# Patient Record
Sex: Female | Born: 1935 | Race: Black or African American | Hispanic: No | Smoking: Never smoker
Health system: Southern US, Community
[De-identification: ages and names within clinical notes are randomized; demographics above are authoritative.]

## PROBLEM LIST (undated history)

## (undated) DIAGNOSIS — B029 Zoster without complications: Secondary | ICD-10-CM

## (undated) DIAGNOSIS — I10 Essential (primary) hypertension: Secondary | ICD-10-CM

## (undated) DIAGNOSIS — Z8551 Personal history of malignant neoplasm of bladder: Secondary | ICD-10-CM

## (undated) HISTORY — PX: CYSTECTOMY W/ CONTINENT DIVERSION: SUR360

---

## 1968-09-19 DIAGNOSIS — Z8551 Personal history of malignant neoplasm of bladder: Secondary | ICD-10-CM

## 1968-09-19 HISTORY — DX: Personal history of malignant neoplasm of bladder: Z85.51

## 1997-06-01 ENCOUNTER — Ambulatory Visit (HOSPITAL_COMMUNITY): Admission: RE | Admit: 1997-06-01 | Discharge: 1997-06-01 | Payer: Self-pay | Admitting: Internal Medicine

## 1997-12-04 ENCOUNTER — Ambulatory Visit: Admission: RE | Admit: 1997-12-04 | Discharge: 1997-12-04 | Payer: Self-pay | Admitting: Gynecologic Oncology

## 1997-12-05 ENCOUNTER — Other Ambulatory Visit: Admission: RE | Admit: 1997-12-05 | Discharge: 1997-12-05 | Payer: Self-pay | Admitting: Gynecologic Oncology

## 1997-12-07 ENCOUNTER — Ambulatory Visit (HOSPITAL_COMMUNITY): Admission: RE | Admit: 1997-12-07 | Discharge: 1997-12-07 | Payer: Self-pay | Admitting: *Deleted

## 1998-01-26 ENCOUNTER — Encounter: Payer: Self-pay | Admitting: Emergency Medicine

## 1998-01-26 ENCOUNTER — Emergency Department (HOSPITAL_COMMUNITY): Admission: EM | Admit: 1998-01-26 | Discharge: 1998-01-27 | Payer: Self-pay | Admitting: Emergency Medicine

## 1998-11-13 ENCOUNTER — Ambulatory Visit: Admission: RE | Admit: 1998-11-13 | Discharge: 1998-11-13 | Payer: Self-pay | Admitting: Gynecologic Oncology

## 1998-11-13 ENCOUNTER — Other Ambulatory Visit: Admission: RE | Admit: 1998-11-13 | Discharge: 1998-11-13 | Payer: Self-pay | Admitting: Gynecologic Oncology

## 1998-12-10 ENCOUNTER — Encounter: Admission: RE | Admit: 1998-12-10 | Discharge: 1998-12-10 | Payer: Self-pay | Admitting: *Deleted

## 1998-12-10 ENCOUNTER — Ambulatory Visit (HOSPITAL_COMMUNITY): Admission: RE | Admit: 1998-12-10 | Discharge: 1998-12-10 | Payer: Self-pay | Admitting: *Deleted

## 1998-12-10 ENCOUNTER — Encounter: Payer: Self-pay | Admitting: *Deleted

## 1999-11-19 ENCOUNTER — Ambulatory Visit (HOSPITAL_COMMUNITY): Admission: RE | Admit: 1999-11-19 | Discharge: 1999-11-19 | Payer: Self-pay | Admitting: Gastroenterology

## 1999-11-19 ENCOUNTER — Encounter (INDEPENDENT_AMBULATORY_CARE_PROVIDER_SITE_OTHER): Payer: Self-pay | Admitting: *Deleted

## 1999-12-16 ENCOUNTER — Ambulatory Visit (HOSPITAL_COMMUNITY): Admission: RE | Admit: 1999-12-16 | Discharge: 1999-12-16 | Payer: Self-pay | Admitting: *Deleted

## 2000-02-10 ENCOUNTER — Other Ambulatory Visit: Admission: RE | Admit: 2000-02-10 | Discharge: 2000-02-10 | Payer: Self-pay | Admitting: Gynecologic Oncology

## 2000-02-10 ENCOUNTER — Ambulatory Visit: Admission: RE | Admit: 2000-02-10 | Discharge: 2000-02-10 | Payer: Self-pay | Admitting: Gynecologic Oncology

## 2000-12-21 ENCOUNTER — Encounter: Payer: Self-pay | Admitting: Internal Medicine

## 2000-12-21 ENCOUNTER — Ambulatory Visit (HOSPITAL_COMMUNITY): Admission: RE | Admit: 2000-12-21 | Discharge: 2000-12-21 | Payer: Self-pay | Admitting: *Deleted

## 2001-04-06 ENCOUNTER — Other Ambulatory Visit: Admission: RE | Admit: 2001-04-06 | Discharge: 2001-04-06 | Payer: Self-pay | Admitting: Gynecologic Oncology

## 2001-04-06 ENCOUNTER — Ambulatory Visit: Admission: RE | Admit: 2001-04-06 | Discharge: 2001-04-06 | Payer: Self-pay | Admitting: Gynecologic Oncology

## 2001-11-29 ENCOUNTER — Encounter: Payer: Self-pay | Admitting: Emergency Medicine

## 2001-11-29 ENCOUNTER — Emergency Department (HOSPITAL_COMMUNITY): Admission: EM | Admit: 2001-11-29 | Discharge: 2001-11-29 | Payer: Self-pay | Admitting: Emergency Medicine

## 2001-12-27 ENCOUNTER — Ambulatory Visit (HOSPITAL_COMMUNITY): Admission: RE | Admit: 2001-12-27 | Discharge: 2001-12-27 | Payer: Self-pay | Admitting: Internal Medicine

## 2001-12-27 ENCOUNTER — Encounter: Payer: Self-pay | Admitting: Internal Medicine

## 2001-12-28 ENCOUNTER — Encounter (INDEPENDENT_AMBULATORY_CARE_PROVIDER_SITE_OTHER): Payer: Self-pay | Admitting: Specialist

## 2001-12-28 ENCOUNTER — Ambulatory Visit (HOSPITAL_COMMUNITY): Admission: RE | Admit: 2001-12-28 | Discharge: 2001-12-28 | Payer: Self-pay | Admitting: Gastroenterology

## 2002-05-09 ENCOUNTER — Ambulatory Visit: Admission: RE | Admit: 2002-05-09 | Discharge: 2002-05-09 | Payer: Self-pay | Admitting: Gynecologic Oncology

## 2002-05-09 ENCOUNTER — Encounter (INDEPENDENT_AMBULATORY_CARE_PROVIDER_SITE_OTHER): Payer: Self-pay

## 2002-05-09 ENCOUNTER — Other Ambulatory Visit: Admission: RE | Admit: 2002-05-09 | Discharge: 2002-05-09 | Payer: Self-pay | Admitting: Gynecologic Oncology

## 2002-11-17 ENCOUNTER — Ambulatory Visit (HOSPITAL_COMMUNITY): Admission: RE | Admit: 2002-11-17 | Discharge: 2002-11-18 | Payer: Self-pay | Admitting: Ophthalmology

## 2002-11-17 ENCOUNTER — Encounter (INDEPENDENT_AMBULATORY_CARE_PROVIDER_SITE_OTHER): Payer: Self-pay | Admitting: *Deleted

## 2003-03-08 ENCOUNTER — Ambulatory Visit (HOSPITAL_COMMUNITY): Admission: RE | Admit: 2003-03-08 | Discharge: 2003-03-09 | Payer: Self-pay | Admitting: Ophthalmology

## 2003-09-25 ENCOUNTER — Ambulatory Visit: Admission: RE | Admit: 2003-09-25 | Discharge: 2003-09-25 | Payer: Self-pay | Admitting: Gynecologic Oncology

## 2003-09-25 ENCOUNTER — Other Ambulatory Visit: Admission: RE | Admit: 2003-09-25 | Discharge: 2003-10-18 | Payer: Self-pay | Admitting: Obstetrics and Gynecology

## 2003-09-25 ENCOUNTER — Encounter (INDEPENDENT_AMBULATORY_CARE_PROVIDER_SITE_OTHER): Payer: Self-pay | Admitting: *Deleted

## 2003-12-06 ENCOUNTER — Encounter: Admission: RE | Admit: 2003-12-06 | Discharge: 2003-12-06 | Payer: Self-pay | Admitting: Internal Medicine

## 2004-09-23 ENCOUNTER — Other Ambulatory Visit: Admission: RE | Admit: 2004-09-23 | Discharge: 2004-09-23 | Payer: Self-pay | Admitting: Gynecologic Oncology

## 2004-09-23 ENCOUNTER — Ambulatory Visit: Admission: RE | Admit: 2004-09-23 | Discharge: 2004-09-23 | Payer: Self-pay | Admitting: Gynecologic Oncology

## 2004-09-23 ENCOUNTER — Encounter (INDEPENDENT_AMBULATORY_CARE_PROVIDER_SITE_OTHER): Payer: Self-pay | Admitting: *Deleted

## 2004-12-23 ENCOUNTER — Encounter: Admission: RE | Admit: 2004-12-23 | Discharge: 2004-12-23 | Payer: Self-pay | Admitting: Internal Medicine

## 2005-10-06 ENCOUNTER — Ambulatory Visit: Admission: RE | Admit: 2005-10-06 | Discharge: 2005-10-06 | Payer: Self-pay | Admitting: Gynecologic Oncology

## 2005-10-06 ENCOUNTER — Other Ambulatory Visit: Admission: RE | Admit: 2005-10-06 | Discharge: 2005-10-06 | Payer: Self-pay | Admitting: Gynecologic Oncology

## 2005-10-06 ENCOUNTER — Encounter (INDEPENDENT_AMBULATORY_CARE_PROVIDER_SITE_OTHER): Payer: Self-pay | Admitting: *Deleted

## 2005-12-29 ENCOUNTER — Encounter: Admission: RE | Admit: 2005-12-29 | Discharge: 2005-12-29 | Payer: Self-pay | Admitting: Internal Medicine

## 2006-09-21 ENCOUNTER — Encounter: Payer: Self-pay | Admitting: Gynecology

## 2006-09-21 ENCOUNTER — Ambulatory Visit: Admission: RE | Admit: 2006-09-21 | Discharge: 2006-09-21 | Payer: Self-pay | Admitting: Gynecologic Oncology

## 2006-09-21 ENCOUNTER — Other Ambulatory Visit: Admission: RE | Admit: 2006-09-21 | Discharge: 2006-09-21 | Payer: Self-pay | Admitting: Gynecologic Oncology

## 2007-01-27 ENCOUNTER — Encounter: Admission: RE | Admit: 2007-01-27 | Discharge: 2007-01-27 | Payer: Self-pay | Admitting: Internal Medicine

## 2007-10-11 ENCOUNTER — Encounter: Payer: Self-pay | Admitting: Gynecologic Oncology

## 2007-10-11 ENCOUNTER — Other Ambulatory Visit: Admission: RE | Admit: 2007-10-11 | Discharge: 2007-10-11 | Payer: Self-pay | Admitting: Gynecologic Oncology

## 2007-10-11 ENCOUNTER — Ambulatory Visit: Admission: RE | Admit: 2007-10-11 | Discharge: 2007-10-11 | Payer: Self-pay | Admitting: Gynecologic Oncology

## 2008-01-31 ENCOUNTER — Encounter: Admission: RE | Admit: 2008-01-31 | Discharge: 2008-01-31 | Payer: Self-pay | Admitting: Internal Medicine

## 2008-08-15 ENCOUNTER — Other Ambulatory Visit: Admission: RE | Admit: 2008-08-15 | Discharge: 2008-08-15 | Payer: Self-pay | Admitting: Gynecologic Oncology

## 2008-08-15 ENCOUNTER — Ambulatory Visit: Admission: RE | Admit: 2008-08-15 | Discharge: 2008-08-15 | Payer: Self-pay | Admitting: Gynecologic Oncology

## 2008-08-15 ENCOUNTER — Encounter (INDEPENDENT_AMBULATORY_CARE_PROVIDER_SITE_OTHER): Payer: Self-pay | Admitting: Gynecologic Oncology

## 2009-01-31 ENCOUNTER — Encounter: Admission: RE | Admit: 2009-01-31 | Discharge: 2009-01-31 | Payer: Self-pay | Admitting: Internal Medicine

## 2009-06-15 ENCOUNTER — Inpatient Hospital Stay (HOSPITAL_COMMUNITY): Admission: EM | Admit: 2009-06-15 | Discharge: 2009-06-20 | Payer: Self-pay | Admitting: Emergency Medicine

## 2009-07-25 ENCOUNTER — Ambulatory Visit: Admission: RE | Admit: 2009-07-25 | Discharge: 2009-07-25 | Payer: Self-pay | Admitting: Gynecologic Oncology

## 2009-07-25 ENCOUNTER — Other Ambulatory Visit: Admission: RE | Admit: 2009-07-25 | Discharge: 2009-07-25 | Payer: Self-pay | Admitting: Gynecologic Oncology

## 2010-02-04 ENCOUNTER — Encounter
Admission: RE | Admit: 2010-02-04 | Discharge: 2010-02-04 | Payer: Self-pay | Source: Home / Self Care | Attending: Internal Medicine | Admitting: Internal Medicine

## 2010-02-09 ENCOUNTER — Encounter: Payer: Self-pay | Admitting: Internal Medicine

## 2010-04-07 LAB — COMPREHENSIVE METABOLIC PANEL
ALT: 16 U/L (ref 0–35)
AST: 32 U/L (ref 0–37)
Albumin: 2.7 g/dL — ABNORMAL LOW (ref 3.5–5.2)
Albumin: 3.5 g/dL (ref 3.5–5.2)
Alkaline Phosphatase: 33 U/L — ABNORMAL LOW (ref 39–117)
BUN: 29 mg/dL — ABNORMAL HIGH (ref 6–23)
BUN: 34 mg/dL — ABNORMAL HIGH (ref 6–23)
BUN: 37 mg/dL — ABNORMAL HIGH (ref 6–23)
CO2: 29 mEq/L (ref 19–32)
CO2: 30 mEq/L (ref 19–32)
Calcium: 10.4 mg/dL (ref 8.4–10.5)
Calcium: 8.2 mg/dL — ABNORMAL LOW (ref 8.4–10.5)
Calcium: 8.8 mg/dL (ref 8.4–10.5)
Chloride: 103 mEq/L (ref 96–112)
Chloride: 112 mEq/L (ref 96–112)
Chloride: 96 mEq/L (ref 96–112)
Creatinine, Ser: 1.55 mg/dL — ABNORMAL HIGH (ref 0.4–1.2)
Creatinine, Ser: 1.62 mg/dL — ABNORMAL HIGH (ref 0.4–1.2)
Creatinine, Ser: 1.83 mg/dL — ABNORMAL HIGH (ref 0.4–1.2)
GFR calc Af Amer: 36 mL/min — ABNORMAL LOW (ref >60)
GFR calc non Af Amer: 26 mL/min — ABNORMAL LOW (ref >60)
GFR calc non Af Amer: 30 mL/min — ABNORMAL LOW (ref >60)
GFR calc non Af Amer: 32 mL/min — ABNORMAL LOW (ref >60)
Glucose, Bld: 138 mg/dL — ABNORMAL HIGH (ref 70–99)
Potassium: 2.8 mEq/L — ABNORMAL LOW (ref 3.5–5.1)
Sodium: 137 mEq/L (ref 135–145)
Total Bilirubin: 0.6 mg/dL (ref 0.3–1.2)
Total Bilirubin: 0.8 mg/dL (ref 0.3–1.2)
Total Bilirubin: 1.2 mg/dL (ref 0.3–1.2)
Total Protein: 7.4 g/dL (ref 6.0–8.3)

## 2010-04-07 LAB — CBC
HCT: 28.4 % — ABNORMAL LOW (ref 36.0–46.0)
HCT: 28.8 % — ABNORMAL LOW (ref 36.0–46.0)
HCT: 30.3 % — ABNORMAL LOW (ref 36.0–46.0)
HCT: 30.7 % — ABNORMAL LOW (ref 36.0–46.0)
HCT: 34.3 % — ABNORMAL LOW (ref 36.0–46.0)
Hemoglobin: 9.2 g/dL — ABNORMAL LOW (ref 12.0–15.0)
Hemoglobin: 11.2 g/dL — ABNORMAL LOW (ref 12.0–15.0)
Hemoglobin: 9.6 g/dL — ABNORMAL LOW (ref 12.0–15.0)
MCHC: 32.4 g/dL (ref 30.0–36.0)
MCHC: 32.8 g/dL (ref 30.0–36.0)
MCHC: 33.3 g/dL (ref 30.0–36.0)
MCHC: 33.4 g/dL (ref 30.0–36.0)
MCHC: 33.6 g/dL (ref 30.0–36.0)
MCV: 90.5 fL (ref 78.0–100.0)
MCV: 89.6 fL (ref 78.0–100.0)
MCV: 89.6 fL (ref 78.0–100.0)
MCV: 90 fL (ref 78.0–100.0)
Platelets: 195 10*3/uL (ref 150–400)
Platelets: 223 10*3/uL (ref 150–400)
Platelets: 223 10*3/uL (ref 150–400)
RBC: 3.14 MIL/uL — ABNORMAL LOW (ref 3.87–5.11)
RBC: 3.38 MIL/uL — ABNORMAL LOW (ref 3.87–5.11)
RBC: 3.81 MIL/uL — ABNORMAL LOW (ref 3.87–5.11)
RDW: 14.5 % (ref 11.5–15.5)
RDW: 13.9 % (ref 11.5–15.5)
RDW: 14.4 % (ref 11.5–15.5)
WBC: 10.5 10*3/uL (ref 4.0–10.5)
WBC: 11.8 10*3/uL — ABNORMAL HIGH (ref 4.0–10.5)
WBC: 7.2 10*3/uL (ref 4.0–10.5)
WBC: 7.3 10*3/uL (ref 4.0–10.5)

## 2010-04-07 LAB — GLUCOSE, CAPILLARY
Glucose-Capillary: 105 mg/dL — ABNORMAL HIGH (ref 70–99)
Glucose-Capillary: 122 mg/dL — ABNORMAL HIGH (ref 70–99)
Glucose-Capillary: 123 mg/dL — ABNORMAL HIGH (ref 70–99)
Glucose-Capillary: 99 mg/dL (ref 70–99)
Glucose-Capillary: 105 mg/dL — ABNORMAL HIGH (ref 70–99)
Glucose-Capillary: 106 mg/dL — ABNORMAL HIGH (ref 70–99)
Glucose-Capillary: 107 mg/dL — ABNORMAL HIGH (ref 70–99)
Glucose-Capillary: 114 mg/dL — ABNORMAL HIGH (ref 70–99)
Glucose-Capillary: 117 mg/dL — ABNORMAL HIGH (ref 70–99)
Glucose-Capillary: 125 mg/dL — ABNORMAL HIGH (ref 70–99)
Glucose-Capillary: 141 mg/dL — ABNORMAL HIGH (ref 70–99)
Glucose-Capillary: 145 mg/dL — ABNORMAL HIGH (ref 70–99)
Glucose-Capillary: 92 mg/dL (ref 70–99)
Glucose-Capillary: 93 mg/dL (ref 70–99)

## 2010-04-07 LAB — URINALYSIS, ROUTINE W REFLEX MICROSCOPIC
Bilirubin Urine: NEGATIVE
Glucose, UA: NEGATIVE mg/dL
Ketones, ur: NEGATIVE mg/dL
Leukocytes, UA: NEGATIVE
Nitrite: NEGATIVE
Protein, ur: >300 mg/dL — AB
Specific Gravity, Urine: 1.015 (ref 1.005–1.030)
Urobilinogen, UA: 0.2 mg/dL (ref 0.0–1.0)
pH: 6.5 (ref 5.0–8.0)

## 2010-04-07 LAB — BASIC METABOLIC PANEL
BUN: 23 mg/dL (ref 6–23)
CO2: 19 mEq/L (ref 19–32)
CO2: 22 mEq/L (ref 19–32)
Calcium: 7.3 mg/dL — ABNORMAL LOW (ref 8.4–10.5)
Chloride: 119 mEq/L — ABNORMAL HIGH (ref 96–112)
Creatinine, Ser: 1.28 mg/dL — ABNORMAL HIGH (ref 0.4–1.2)
GFR calc Af Amer: 48 mL/min — ABNORMAL LOW (ref >60)
GFR calc non Af Amer: 39 mL/min — ABNORMAL LOW (ref >60)
Glucose, Bld: 117 mg/dL — ABNORMAL HIGH (ref 70–99)
Glucose, Bld: 132 mg/dL — ABNORMAL HIGH (ref 70–99)
Potassium: 4.7 mEq/L (ref 3.5–5.1)
Potassium: 4.7 mEq/L (ref 3.5–5.1)
Sodium: 142 mEq/L (ref 135–145)
Sodium: 145 mEq/L (ref 135–145)

## 2010-04-07 LAB — DIFFERENTIAL
Basophils Absolute: 0 10*3/uL (ref 0.0–0.1)
Basophils Relative: 0 % (ref 0–1)
Eosinophils Absolute: 0 10*3/uL (ref 0.0–0.7)
Eosinophils Relative: 0 % (ref 0–5)
Lymphocytes Relative: 13 % (ref 12–46)
Lymphs Abs: 0.9 10*3/uL (ref 0.7–4.0)
Monocytes Absolute: 0.6 10*3/uL (ref 0.1–1.0)
Monocytes Relative: 8 % (ref 3–12)
Neutro Abs: 5.6 10*3/uL (ref 1.7–7.7)
Neutrophils Relative %: 79 % — ABNORMAL HIGH (ref 43–77)

## 2010-04-07 LAB — URINE MICROSCOPIC-ADD ON

## 2010-04-07 LAB — HEMOGLOBIN A1C
Hgb A1c MFr Bld: 5.7 % — ABNORMAL HIGH (ref <5.7)
Mean Plasma Glucose: 117 mg/dL — ABNORMAL HIGH (ref <117)

## 2010-06-03 NOTE — Consult Note (Signed)
[name], [name]                 ACCOUNT NO.:  [id number]   MEDICAL RECORD NO.:  [id number]          PATIENT TYPE:  OUT   LOCATION:  GYN                          FACILITY:  [facility]   PHYSICIAN:  John T. [name], M.D.    DATE OF BIRTH:  1919/06/04   DATE OF CONSULTATION:  08/15/2008  DATE OF DISCHARGE:                                 CONSULTATION   CHIEF COMPLAINT:  This 77 year old African American woman returns for  ongoing followup of cervical cancer treated with pelvic exenteration.   HISTORY OF PRESENT ILLNESS:  This patient developed recurrent cervical  cancer following total pelvic radiation and underwent an anterior  exenteration at [facility] in June 1985.  Followups since have been without evidence of recurrent disease.  Her  urostomy has functioned well and is currently giving her no problems.  She does note an occasional vaginal discharge, but no bleeding.  Bowel  function is normal.  She is up to date on colonoscopy.  Denies  unintentional weight change, obstructive type symptoms or leg swelling.   PAST MEDICAL HISTORY:  Significant for hypertension.   CURRENT MEDICATIONS:  1. Atenolol.  2. Fosamax.   ALLERGIES:  NONE KNOWN.   PERSONAL/SOCIAL HISTORY:  Widowed, nonsmoker.   FAMILY HISTORY:  Noncontributory.   REVIEW OF SYSTEMS:  No other complaints in a 10-system review.   PHYSICAL EXAMINATION:  VITAL SIGNS:  Weight 115 pounds (stable) and  vital signs stable as recorded with blood pressure 136/78.  LYMPH SURVEY:  Reveals no pathologic lymphadenopathy.  BACK:  Slight widow's hump with no spinous or CVA tenderness.  ABDOMEN:  Soft and benign with a left urostomy bag.  Incision well  healed with no hernia.  There is no ascites, tenderness or mass.  EXTREMITIES:  No edema, cords or Homan's.  PELVIC:  External genitalia normal.  The vagina is foreshortened to  approximately 4 cm with clear mucosa.  Absent urethra and bladder.  Bimanual  examination and rectovaginal examinations reveal no mass or  nodularity, absent uterus and cervix.   ASSESSMENT:  Cervical carcinoma, no active disease.   PLAN:  Cytology obtained and will be communicated to the patient.  She  should continue to have annual cytology and we would be glad to see her  on a p.r.n. basis or annually.      John T. [name], M.D.  Electronically Signed     JTS/MEDQ  D:  08/15/2008  T:  08/15/2008  Job:  [id number]   cc:   Telford Nab, R.N.  [address]  [place], [place] [address]   Gaspar Garbe, M.D.  Fax: [phone]

## 2010-06-03 NOTE — Consult Note (Signed)
NAMEWILLIE, Crystal                 ACCOUNT NO.:  [id number]   MEDICAL RECORD NO.:  [id number]          PATIENT TYPE:  OUT   LOCATION:  GYN                          FACILITY:  [facility]   PHYSICIAN:  John T. [name], M.D.    DATE OF BIRTH:  1919-01-31   DATE OF CONSULTATION:  09/21/2006  DATE OF DISCHARGE:                                 CONSULTATION   CHIEF COMPLAINT:  Followup of cervical cancer treated with anterior  exenteration.   HISTORY OF PRESENT ILLNESS:  This patient developed recurrent cervical  carcinoma following total pelvic radiation and underwent an anterior  exenteration at [facility] in June 1985.  She has  been followed with frequent examinations through September 2007, with no  evidence of recurrent disease.  She relates no new medical problems.  She has chronic intermittent right leg edema which is stable.  She has a  tendency towards diarrhea, depending upon dietary indiscretion.  She  denies vaginal bleeding, pelvic pain, or problems with her ileal  conduit.   PAST HISTORY, PERSONAL AND SOCIAL HISTORY, FAMILY HISTORY:  Are reviewed  and unchanged from those recorded in 2005.   MEDICATIONS:  Atenolol, Fosamax.   ALLERGIES:  None known.   REVIEW OF SYSTEMS:  The patient states that she is slowing down because  of age, but has no other specific complaints in a 10-system review.   PHYSICAL EXAMINATION:  VITAL SIGNS:  Weight 116.5 pounds, blood pressure  166/84.  GENERAL:  The patient is alert and oriented x3, in no acute distress.  LYMPH NODE SURVEY:  No pathologic lymphadenopathy.  BACK:  Kyphoscoliosis, with no spinous or CVA tenderness.  ABDOMEN:  Scaphoid, soft, and benign, with no mass, ascites, or hernia.  Conduit stoma is without prolapse or peristomal hernia.  EXTREMITIES:  Have no edema, cords or Homans'.  PELVIC:  External genitalia and BUS are normal.  The urethra is  surgically absent.  The vagina is foreshortened to a depth of 4  cm, with  absent cervix and uterus, and no submucosal masses.  Bimanual and  rectovaginal examinations disclose no mass or nodularity.   ASSESSMENT:  Cervical carcinoma, no evidence of disease.   PLAN:  The patient will continue vaginal Premarin, and cytology is  repeated.  She remains at high risk and requires annual cytologic  surveillance.  We will be glad to continue to see her on an annual  basis.      John T. [name], M.D.  Electronically Signed     JTS/MEDQ  D:  09/21/2006  T:  09/21/2006  Job:  [id number]   cc:   Telford Nab, R.N.  [address]  [place], [place] [address]

## 2010-06-03 NOTE — Consult Note (Signed)
[name], [name]                 ACCOUNT NO.:  [id number]   MEDICAL RECORD NO.:  [id number]          PATIENT TYPE:  OUT   LOCATION:  GYN                          FACILITY:  [facility]   PHYSICIAN:  Paola A. Duard Brady, MD    DATE OF BIRTH:  Jul 24, 1919   DATE OF CONSULTATION:  DATE OF DISCHARGE:                                 CONSULTATION   HISTORY:  The patient is an 77 year old with a history of cervical  carcinoma who developed recurrent disease after total pelvic radiation.  She underwent anterior exoneration at [facility] by Dr. [name] in  1985 and has been free of disease since that time.  She was last seen by  Dr. [name] in September 2008 at which time her exam and Pap smear were  unremarkable.  She comes in today for followup and is overall without  complaints.   REVIEW OF SYSTEMS:  She denies any chest pain, shortness breath, nausea,  vomiting, fevers, chills, headaches or visual changes.  She denies any  significant change in her bowel or bladder habits.  She does have bowel  movements twice a day usually after breakfast and dinner.  Occasionally,  she will have bowel movements 3 times a day particularly if she eats  green beans or lettuce.  There is no blood in her stool.  She had a  colonoscopy in November 2008.  She occasionally will have developed a  sore or boil where her urostomy bag rubs just under her ribs, but it  does not require any change in her stomal appliances.  She either tapes  it down or cuts it, and that alleviates the symptoms.  She denies any  unintentional weight loss, weight gain, nausea, vomiting, fevers,  chills, headaches or visual changes.  She denies any vaginal bleeding.   MEDICATIONS:  She does not recall the names, but she is on  antihypertensive.  She is also on calcium. Does not recall the names of  her other medications.   HEALTH MAINTENANCE:  She had a mammogram about 2 months ago.  She had a  colonoscopy in November 2008.   PHYSICAL  EXAMINATION:  VITAL SIGNS:  Blood pressure 180/104.  GENERAL:  Well-nourished, well-developed female in no acute distress.  NECK:  Supple.  There is no lymphadenopathy, no thyromegaly.  LUNGS:  Clear to auscultation bilaterally.  CARDIOVASCULAR:  Regular rate and rhythm.  She may have a 1/6 systolic  ejection murmur.  ABDOMEN:  Shows a well-healed vertical midline incision.  She has a left  upper quadrant urostomy bag.  There is clear urine.  The stoma is pink  with clean mucosa.  Groins are negative for adenopathy.  EXTREMITIES:  There is no edema.  PELVIC:  External genitalia is within normal limits, though atrophic.  Vagina is markedly foreshortened.  The vaginal cuff is visualized.  There are no visible lesions.  ThinPrep Pap was submitted without  difficulty.  Bimanual examination with rectovaginal examination reveals  no nodularity or masses.   ASSESSMENT:  An 77 year old with history of recurrent cervical carcinoma  after curative dose radiation  therapy who underwent an anterior  exoneration at [facility] and has been free of disease.   PLAN:  1. We will follow up the results of her Pap smear from today.  I      discussed with her that Dr. Calton Dach days here in [place] are      going to be much more limited than they have been in the past.  She      was offered follow up in [place] to be able to see him.  She      states that she would rather be followed here and will return to      see [place] in      1 year.  2. She was asked to bring a list of her medications at the time of her      next visit so we can update our records here.  3. She will follow up with [place] in 1 year and continue following with      her primary physician.      Paola A. Duard Brady, MD  Electronically Signed     PAG/MEDQ  D:  10/11/2007  T:  10/12/2007  Job:  [id number]   cc:   Telford Nab, R.N.  [address]  [place], [place] [address]   Gaspar Garbe, M.D.  Fax: [phone]

## 2010-06-06 NOTE — Consult Note (Signed)
NAMEJACK, MINEAU                           ACCOUNT NO.:  [id number]   MEDICAL RECORD NO.:  [id number]                   PATIENT TYPE:  OUT   LOCATION:  GYN                                  FACILITY:  [facility]   PHYSICIAN:  John T. [name], M.D.                 DATE OF BIRTH:  Jun 07, 1919   DATE OF CONSULTATION:  05/09/2002  DATE OF DISCHARGE:                                   CONSULTATION   CHIEF COMPLAINT:  The patient returns for ongoing followup of cervical  cancer.   HISTORY OF PRESENT ILLNESS:  The patient underwent anterior exoneration in  6/85, following radiation therapy for cervical cancer.  She has been  followed with frequent examinations through 3/03, without evidence of  recurrent disease.   INTERVAL HISTORY:  Since she was last seen, she had negative colonoscopy by  Dr. Ewing Schlein.  She states that her bowel function has essentially normalized  since that time.  She denies vaginal bleeding.  She has chronic intermittent  right leg edema dating back to her exoneration.   PAST MEDICAL HISTORY:  Hypertension.   PAST SURGICAL HISTORY:  As above.   SOCIAL HISTORY:  Denies tobacco, admits rare ethanol.  Recently widowed.   MEDICATIONS:  1. Atenolol.  2. Premarin.   ALLERGIES:  No known drug allergies.   REVIEW OF SYMPTOMS:  The patient otherwise denies problems.   PHYSICAL EXAMINATION:  VITAL SIGNS:  Weight is 119 pounds, vital signs  stable and afebrile.  GENERAL:  The patient is alert and oriented x3, in no acute distress.  HEENT:  Benign, clear oropharynx.  BACK:  There is no back or CVA tenderness.  ABDOMEN:  Soft and benign with well-healed incision and no hernias.  There  is a functioning ileal conduit.  There is no ascites, mass, or organomegaly.  EXTREMITIES:  Full range of motion and strength with minimal right pretibial  edema.  PELVIC:  External genitalia and BUS are normal to inspection and palpation.  The vagina is foreshortened to approximately 3.5 cm  without mucosal lesions.  Bladder and urethra are absent.  Bimanual and rectovaginal examinations  reveal absent uterus and cervix without masses or nodularities.   ASSESSMENT:  Cervical carcinoma, NED.    PLAN:  Refills for vaginal Premarin given.  Pap smear obtained.  We will  continue to see the patient.  She should continue to have cytology on an  annual basis.                                                John T. [name], M.D.    JTS/MEDQ  D:  05/09/2002  T:  05/09/2002  Job:  [id number]   cc:   Telford Nab, R.N.   Lavada Mesi.  Magod, M.D.  [address]  [place]  [place] [address]  Fax: [phone]   Gaspar Garbe, M.D.  [address]  [place]  [place] [address]  Fax: [phone]

## 2010-06-06 NOTE — Op Note (Signed)
[name], [name]                           ACCOUNT NO.:  [id number]   MEDICAL RECORD NO.:  [id number]                   PATIENT TYPE:  OIB   LOCATION:  2870                                 FACILITY:  [facility]   PHYSICIAN:  Beulah Gandy. Ashley Royalty, M.D.              DATE OF BIRTH:  20-Nov-1919   DATE OF PROCEDURE:  11/17/2002  DATE OF DISCHARGE:                                 OPERATIVE REPORT   PREOPERATIVE DIAGNOSIS:  Retinal detachment, dislocated intraocular lens,  retained lens material, right eye.   PROCEDURE:  Pars plana vitrectomy, scleral buckle, retinal photocoagulation,  removal of lens remnants, removal of intraocular lens through limbus,  perfluoropropane injection all in the right eye.   SURGEON:  Beulah Gandy. Ashley Royalty, M.D.   ASSISTANT:  Bryan Lemma. Lundquist, P.A.   ANESTHESIA:  General.   DESCRIPTION OF PROCEDURE:  Usual prep and drape.  360 degree limbal  peritomy.  Isolation of four rectus muscles on 2-0 silk.  Localization of  break in the lower nasal quadrant.  Scleral dissection from 2 o'clock around  to 10 o'clock to admit a #279 intrascleral implant. Diathermy placed in the  bed.  279 implant was placed.  240 band was placed around the eye with a 270  sleeve at 2 o'clock.  A belt loop was at 12 o'clock.  Perforation site  chosen at 4 o'clock with a large amount of clear colorless subretinal fluid  coming forth.  The scleral flaps were closed over the scleral buckle.  Attention was carried to the limbal area where the previous corneal incision  was enlarged.  The intraocular lens was grasped with vitreous forceps and  brought into the anterior chamber.  It was cut in half and dialed out of the  eye through the 4 mm wound.  Infusion port was placed at 8 o'clock.  Lighted  pick and cutter were placed at 10 and 2 o'clock.  The corneal wound was  closed with 10-0 nylon interrupted sutures.  The wound was tested and found  to be tight.  Pars plana vitrectomy was begun just  behind the pupillary axis  where large amounts of white fluffy lens material were encountered.  These  were carefully removed under low suction and rapid cutting with scleral  depression.  All of the remnants were removed.  Some capsular remnants were  allowed to remain in order to support the intraocular lens in the future.  The vitrectomy was carried posteriorly where additional lens fragments were  removed.  All of the vitreous was carefully removed.  The retina was thrown  into folds in the lower nasal quadrant.  Perfluoropropane was injected to  reattach the retina.  The endolaser was positioned in the eye and 343 burns  were placed around the retinal break and on the retinal detachment from 3  o'clock to 7 o'clock.  The power was 800 milliwatts 1000 microns  each and  0.1 seconds each.  A gas/fluid exchange was then carried out with removal of  the perfluoropropane.  C3F8 in a 14% concentration was then exchanged for  intravitreal gas.  The instruments were removed from the eye and 9-0 nylon  was used to close the sclerotomy sites.  The band was adjusted and trimmed.  The buckle was adjusted and trimmed.  The sutures were knotted and trimmed.  The conjunctiva was reposited with 7-0 chromic suture.  Polymixin and  gentamicin were irrigated into tenons space.  Atropine solution was applied.  Closing tension was 10 with a Barraquer tonometer.  Decadron 10 mg was  injected into the lower subconjunctival space.  Marcaine was injected around  the globe for postoperative pain.  Polysporin, a patch, and shield were  placed.  The patient was awakened and taken to the recovery room in  satisfactory condition.  Complications were none.  Duration was 2-1/2 hours.                                              Beulah Gandy. Ashley Royalty, M.D.   JDM/MEDQ  D:  11/17/2002  T:  11/17/2002  Job:  [id number]

## 2010-06-06 NOTE — Consult Note (Signed)
[facility]  Patient:    [name], [name] Visit Number: [id number] MRN: [id number]          Service Type: GON Location: GYN Attending Physician:  Sabino Donovan Dictated by:   Jackquline Denmark. [name], M.D. Proc. Date: 04/06/01 Admit Date:  04/06/2001   CC:         Telford Nab, R.N.  Petra Kuba, M.D.   Consultation Report  CHIEF COMPLAINT:  The patient returns for ongoing follow-up of cervical cancer.  HISTORY OF PRESENT ILLNESS:  The patient underwent anterior exenteration in June 1985 following radiation therapy for cervical cancer. She has been followed with frequent examinations through January 2002 without evidence of recurrent disease.  INTERVAL HISTORY:  Since she was last seen, she continues to have mild fecal incontinence and it has been suggested that she see a rectal manometrist at [facility] or [facility], but this has not been scheduled to date. She denies back pain, leg pain, or vaginal bleeding. She has chronic intermittent right leg edema which dates back to the time of her exenteration.  PAST MEDICAL HISTORY:  Hypertension.  PAST SURGICAL HISTORY:  As above.  PERSONAL SOCIAL HISTORY:  The patient denies tobacco, admits to rare ethanol.  MEDICATIONS:  1. Atenolol.  2. Vaginal Premarin.  ALLERGIES:  None.  REVIEW OF SYSTEMS:  The patient otherwise denies problems. She has had benign rectal polyp removed via colonoscopy.  PHYSICAL EXAMINATION:  VITAL SIGNS:  Weight 124 pounds, blood pressure 160/74. Otherwise stable; afebrile.  GENERAL:  The patient is alert and oriented x3 in no acute distress.  ENT:  Benign with clear oropharynx.  LUNGS:  Fields are clear.  BACK:  There is no back or CVA tenderness.  ABDOMEN:  Soft and benign with well healed incision without hernia. There is a functioning ileal conduit with no hernia or inflammation. There is no ascites, mass or organomegaly.  EXTREMITIES:  Full range of motion and strength  with minimal right pretibial edema.  PELVIC:  External genitalia and BUS are normal to inspection and palpation. The vagina is foreshortened to approximately 3.5 cm without mucosal lesions. The bladder and urethra are absent. Bimanual and rectovaginal examinations reveal absent uterus and cervix without mass or nodularity.  ASSESSMENT:  Cervical carcinoma, NED.  PLAN:  Refills for vaginal Premarin are given. Pap smear obtained and will be communicated to the patient. She will return for routine follow-up in one year. Dictated by:   Jackquline Denmark. [name], M.D. Attending Physician:  Ronita Hipps T DD:  04/06/01 TD:  04/07/01 Job: [id number]

## 2010-06-06 NOTE — Consult Note (Signed)
[name], [name]                           ACCOUNT NO.:  [id number]   MEDICAL RECORD NO.:  [id number]                   PATIENT TYPE:  OUT   LOCATION:  GYN                                  [facility]ACILITY:  [facility]   PHYSICIAN:  John T. [name], M.D.                 DATE OF BIRTH:  14-Sep-1919   DATE OF CONSULTATION:  09/25/2003  DATE OF DISCHARGE:                                   CONSULTATION   CHIEF COMPLAINT:  Follow up of cervical cancer.   HISTORY OF PRESENT ILLNESS:  The patient underwent anterior exenteration in  June, 1985 following radiation therapy for cervical cancer.  She has been  followed with frequent examinations through April, 2004 without evidence of  recurrent disease.   INTERVAL HISTORY SINCE SHE WAS LAST SEEN:  She has had no new medical  problems.  She denies vaginal or rectal bleeding. She has chronic  intermittent right leg edema dating back to her exenteration.   PAST MEDICAL HISTORY:  Hypertension.   PAST SURGICAL HISTORY:  As above.   PERSONAL SOCIAL HISTORY:  Denies tobacco and admits rare ethanol. Widowed.   MEDICATIONS:  Atenolol and Premarin vagina cream.   ALLERGIES:  None known.   REVIEW OF SYSTEMS:  Denies problems.   PHYSICAL EXAMINATION:  VITAL SIGNS:  Weight 124 pounds, blood pressure  142/78.  GENERAL:  The patient is alert and oriented x3 and in no acute distress.  HEENT:  Benign with clear oropharynx.  BACK:  No back or CVA tenderness.  ABDOMEN:  Soft and benign. Well-healed incision and no hernias.  There is a  functioning ileal conduit without prolapse.  There is no ascites, mass or  organomegaly.  EXTREMITIES:  Full range of motion and strength.  LYMPH:  No lymphadenopathy.  PELVIC:  External genitalia and BUS are normal to inspection and palpation.  The vagina is foreshortened to approximately 3 to 3.5 cm.  There are no  mucosal lesions.  Bladder and urethra are absent.  Bimanual and rectovaginal  examinations reveal absent uterus and  cervix without mass or nodularity.   ASSESSMENT:  Cervical carcinoma, NED.   PLAN:  Cytology repeated.  The patient will continue to use intermittent  vaginal Premarin.  We can see her back on a p.r.n. basis but reiterated  advisability of annual evaluations.                                               John T. [name], M.D.    JTS/MEDQ  D:  09/25/2003  T:  09/25/2003  Job:  [id number]   cc:   Telford Nab, R.N.  [address]  [place], [place] [address]   Petra Kuba, M.D.  [address]  [address]  [place] [address]  Fax: [phone]   Gaspar Garbe, M.D.  [address]  [place]  [place] [address]  Fax: [phone]

## 2010-06-06 NOTE — Op Note (Signed)
[name], [name]                           ACCOUNT NO.:  [id number]   MEDICAL RECORD NO.:  [id number]                   PATIENT TYPE:  OIB   LOCATION:  5707                                 FACILITY:  [facility]   PHYSICIAN:  Beulah Gandy. Ashley Royalty, M.D.              DATE OF BIRTH:  [birth year]   DATE OF PROCEDURE:  03/08/2003  DATE OF DISCHARGE:                                 OPERATIVE REPORT   PREOPERATIVE DIAGNOSIS:  Aphakia and previous retinal detachment, right eye.   POSTOPERATIVE DIAGNOSIS:  Aphakia and previous retinal detachment, right  eye.   OPERATION PERFORMED:  Pars plana vitrectomy and placement of secondary  intraocular lens with suture, right eye.   SURGEON:  Beulah Gandy. Ashley Royalty, M.D.   ASSISTANT:  Bryan Lemma. Lundquist, P.A.   ANESTHESIA:  General.   DESCRIPTION OF PROCEDURE:  Usual prep and drape.  Peritomies from 9 o'clock  around to 3 o'clock.  A one half thickness scleral flap was raised at 3 and  9 o'clock in anticipation of intraocular lens suture.  Sclerotomy marks were  made at 8, 10 and 2 o'clock.  The 6 mm infusion port was anchored into place  at 8 o'clock.  The lighted pick and the cutter were placed at 10 and 2  o'clock respectively.  A three-layer corneal wound was created from 11  o'clock to 2 o'clock. The pars plana vitrectomy was begun just behind the  pupillary plane, vitreous attached to the pupillary edge was removed with a  vitreous cutter.  All vitreous was removed from the anterior segment and  then the posterior segment.  Once this was completed, the instruments were  removed from the eye and the corneal wound was opened.  Two 10-0 Prolenes  were passed from 3 to 9 o'clock through the pupillary plane.  The sutures  were externalized.  An intraocular lens was brought onto the field.  It was  made by [employer], model CZ70BD, serial number [id number], power 18  diopter, length 12.5 mm, optic 7.0, expiration date 01/2007.  The lens was  inspected and cleaned.  Sutures were tied to the foot plate of the  intraocular lens.  The intraocular lens was passed through the cornea and  into the ciliary sulcus.  The sutures were pulled tightly and the lens was  lens was dialed into place.  The sutures were tied externally and trimmed.  The flaps were closed.  The corneal wound was closed with four interrupted  sutures.  The sclerotomies were closed with 9-0 nylon suture.  The cornea  had been closed with 10-0 nylon suture.  The conjunctiva was closed with 7-0  chromic suture.  Polymyxin and gentamicin were irrigated into Tenon's space.  Atropine solution was applied.  Decadron 10 mg was injected into the lower  subconjunctival space.  Marcaine was injected around the globe for  postoperative pain.  The closing tension was  10 with a Barraquer tonometer.  Polysporin, a patch and shield were placed.  The patient was awakened and  taken to recovery in satisfactory condition.                                               Beulah Gandy. Ashley Royalty, M.D.    JDM/MEDQ  D:  03/08/2003  T:  03/09/2003  Job:  [id number]

## 2010-06-06 NOTE — Consult Note (Signed)
[name], [name]                 ACCOUNT NO.:  [id number]   MEDICAL RECORD NO.:  [id number]          PATIENT TYPE:  OUT   LOCATION:  GYN                          FACILITY:  [facility]   PHYSICIAN:  John T. [name], M.D.    DATE OF BIRTH:  11-26-1919   DATE OF CONSULTATION:  09/23/2004  DATE OF DISCHARGE:                                   CONSULTATION   A follow-up GYN oncology consultation note.   DATE OF CONSULTATION:  September 23, 2004.   CHIEF COMPLAINT:  Follow-up of cervical cancer treated with pelvic  exenteration.   HISTORY OF PRESENT ILLNESS:  The patient developed recurrent cervical cancer  following total pelvic irradiation and underwent anterior exenteration in  June, 1985.  She has been followed with frequent examinations through  September, 2005 without evidence of recurrent disease.  Since she was last  seen, she relates no new medical problems.  She did note some vaginal  spotting after exercise.  She has chronic intermittent right leg edema  dating back to her exenteration, stable.  Of note, she has not been using  Premarin vaginal cream.  She denies pelvic pain, back pain or obstructive-  type symptoms.  Her ileostomy is functioning well.   Past history, personal/social history, family history are reviewed and  unchanged from those recorded in September, 2005.   MEDICATIONS:  Include atenolol.   ALLERGIES:  None known.   REVIEW OF SYSTEMS:  Other than above, 10-point comprehensive review of  systems is negative.   PHYSICAL EXAMINATION:  VITAL SIGNS:  Stable and afebrile with weight 126  pounds.  GENERAL APPEARANCE:  The patient is alert and oriented times three, in no  acute distress.  There is no pathologic lymphadenopathy.  LUNGS:  Fields are clear.  BACK:  There is no back or CVA tenderness.  ABDOMEN:  Soft and benign with well-healed incision.  The ileostomy is  functional in the left mid abdomen without peristomal hernia.  EXTREMITIES:  Have full range of  motion and intact peripheral pulses with  normal strength.  No current lymphedema.  PELVIC:  External genitalia and BUS are benign.  The urethra is surgically  absent.  The vagina is foreshortened to a depth of 4 cm.  Cervix and uterus  are absent.  Bimanual and rectovaginal examinations disclose no mass or  nodularity.   ASSESSMENT:  Recurrent cervical carcinoma, NAD.  Vaginal spotting likely  related to atrophy.   PLAN:  Cytology will be communicated to the patient.  She should have this  repeated annually.  Vaginal Premarin use weekly was encouraged.      John T. [name], M.D.  Electronically Signed     JTS/MEDQ  D:  09/23/2004  T:  09/23/2004  Job:  [id number]   cc:   Telford Nab, R.N.  [address]  [place], [place] [address]

## 2010-06-06 NOTE — Consult Note (Signed)
[facility]  Patient:    [name], [name]                          MRN: [id number] Proc. Date: 02/10/00 Adm. Date:  [id number] Disc. Date: [id number] Attending:  Nelda Marseille CC:         Petra Kuba, M.D.  Telford Nab, R.N.   Consultation Report  REASON FOR CONSULTATION:  Ms. Yusuf returns for ongoing followup of recurrent cervical carcinoma treated in the remote past with pelvic exenteration.  INTERVAL NOTE:  Since she was last seen, she has done quite well.  She had a benign adenomatous polyp removed from the rectum along with several hyperplastic polyps but no high-grade dysplasia and recommendation for followup colonoscopy and biopsies as needed, coordinated through Dr. Petra Kuba.  She has no new symptoms in terms of bowel or bladder functions.  She denies vaginal bleeding or discharge.  Of note, she has chronic problems with loss of sphincter/fecal continence.  It has been suggested that she see a rectal manometrist at [facility] or [facility] and she is contemplating this.  HISTORY OF PRESENT ILLNESS:  The patient underwent anterior exenteration in June 1985 following radiation therapy for centrally recurrent cervical carcinoma.  She has been followed through November 13, 1998 with normal examinations and Pap smears.  PAST MEDICAL HISTORY:  Past medical history is significant for hypertension.  PAST SURGICAL HISTORY:  As above.  PERSONAL/SOCIAL HISTORY:  Patient denies tobacco, admits to rare ethanol and works as a Pharmacologist.  MEDICATIONS:  Atenolol.  ALLERGIES:  None.  REVIEW OF SYSTEMS:  Otherwise negative.  PHYSICAL EXAMINATION  VITAL SIGNS:  Weight 121 pounds.  Vital signs stable and afebrile.  GENERAL:  Patient is alert and oriented x 3, in no acute distress.  ENT:  Benign with clear oropharynx.  LUNGS:  Lung fields are clear.  BACK:  There is no back or CVA tenderness.  ABDOMEN:  The abdomen is soft and  benign with well-healed incision and functioning ileal conduit without hernia or inflammation.  There are no herniae, ascites, mass or organomegaly.  EXTREMITIES:  Full range of motion and strength with minimal right pretibial edema.  PELVIC:  External genitalia and BUS are normal to inspection and palpation. Vagina is foreshortened to approximately 3.5 cm without mucosal lesions. Bimanual and rectovaginal examinations reveal absent uterus and cervix, without mass or nodularity.  ASSESSMENT:  Cervical carcinoma, no evidence of disease.  PLAN:  Refills for vaginal Premarin are given.  She can continue to be followed at annual intervals.  Pap smear results will be communicated to her. DD:  02/10/00 TD:  02/11/00 Job: [id number]

## 2010-06-06 NOTE — Op Note (Signed)
NAMESANAZ, [name]                           ACCOUNT NO.:  [id number]   MEDICAL RECORD NO.:  [id number]                   PATIENT TYPE:  AMB   LOCATION:  ENDO                                 FACILITY:  [facility]   PHYSICIAN:  Petra Kuba, M.D.                 DATE OF BIRTH:  03/22/1919   DATE OF PROCEDURE:  DATE OF DISCHARGE:                                 OPERATIVE REPORT   PROCEDURE:  Colonoscopy.   INDICATIONS FOR PROCEDURE:  Abnormal CT scan.   Consent was signed after risks, benefits, methods and options were  thoroughly discussed in the office.   MEDICAL DECISION MAKING:  Demerol 50, Versed 5.   DESCRIPTION OF PROCEDURE:  Rectal inspection is pertinent for external  hemorrhoids.  Digital exam was negative.  Pediatric video adjustable  colonoscope was inserted, easily advanced from the colon to the cecum.  This  did require some abdominal pressure but no position changes.  On insertion,  no abnormalities were seen.  The cecum was identified by the appendiceal  orifice and the ileocecal valve, in fact, the scope was inserted a short  ways in the terminal ileum which was normal.  Photo documentation was  obtained.  The scope was slowly withdrawn.  Prep was adequate.  There was  some liquid stool that required washing and suction, and slow withdrawal  through the colon.  The cecum was normal, in the mid ascending a tiny polyp  was seen and was cold biopsied times two.  The scope was further withdrawn,  no additional polyps were seen so we slowly withdrew back to the rectum and  we fell back around the tortuous curve.  We did try to re-advance around the  curve so there was a decreased change of missing things.  Once back in the  rectum, the scope was retroflexed, pertinent for some internal hemorrhoids.  The scope was then re-advanced a short ways up the left side of the colon.  The air was suctioned, scope removed.  The patient tolerated the procedure  well.  There was no  evidence of any complication.   ENDOSCOPIC ASSESSMENT:  1. Internal and external hemorrhoids.  2. Tortuous colon.  3. Tiny ascending polyp, cold biopsied.  4. Otherwise within normal limits to the terminal ileum.    PLAN:  Await pathology, but probably would repeat colonoscopy in five years  if doing well medically, happy to see back p.r.n.  Will await lab work, but  otherwise return care to Drs. Tisovec and Soper for the customary health  care maintenance to include yearly rectals and guaiacs.                                               Petra Kuba, M.D.  MEM/MEDQ  D:  12/28/2001  T:  12/28/2001  Job:  [id number]   cc:   Gaspar Garbe, M.D.  [address]  [place]  [place] [address]  Fax: [phone]   Jackquline Denmark. [name], M.D.  [address]  [place]  [place] [address]  Fax: [phone]

## 2010-06-06 NOTE — Consult Note (Signed)
NAMEAUTYMN, [name]                 ACCOUNT NO.:  [id number]   MEDICAL RECORD NO.:  [id number]          PATIENT TYPE:  OUT   LOCATION:  GYN                          FACILITY:  [facility]   PHYSICIAN:  John T. [name], M.D.    DATE OF BIRTH:  10/01/1919   DATE OF CONSULTATION:  DATE OF DISCHARGE:                                   CONSULTATION   CHIEF COMPLAINT:  Followup of cervical carcinoma, treated with exenteration.   HISTORY OF PRESENT ILLNESS:  This patient developed recurrent cervical  carcinoma following total pelvic radiation, and underwent an anterior  exenteration at [facility] in June 1985.  She has been  followed with frequent examinations through September 2006, without evidence  of recurrent disease.  Since she was last seen, she relates no new medical  problems other than glaucoma.  She has chronic intermittent right leg edema  dating back to her exoneration which is stable.  She denies vaginal  bleeding, change in bowel or bladder function, or problems with her  ileostomy.   PAST HISTORY:  Personal social history, family history, and review of  systems are reviewed and unchanged from prior evaluation in September 29, 2003.  She has hypertension and glaucoma.   MEDICATIONS:  Include atenolol and unknown glaucoma eye drops.   ALLERGIES:  NONE KNOWN.   REVIEW OF SYSTEMS:  Other than noted above, a 10-point comprehensive review  negative.   PHYSICAL EXAMINATION:  VITAL SIGNS:  Weight 126 pounds, blood pressure  170/80.  GENERAL:  The patient is anxious, alert, and oriented x3 in no acute  distress.  LYMPH:  No pathologic lymphadenopathy.  LUNGS:  Lung fields are clear.  BACK: No back or CVA tenderness.  ABDOMEN:  Soft and benign with well-healed incision.  Functional ileal  conduit in the left mid abdomen without peristomal hernia.  EXTREMITIES:  Full strength and range of motion with no current edema.  PELVIC:  External genitalia and BUS benign.   Urethra surgically absent.  Vagina is foreshortened to a depth of 4-cm with absent cervix and uterus.  Bimanual and rectovaginal examinations disclose no mass or nodularity.   ASSESSMENT:  Cervical carcinoma, NED.   PLAN:  Cytology repeated as the patient remains at high risk, and she should  be followed at annual intervals as long as travel is not prohibitive.      John T. [name], M.D.  Electronically Signed     JTS/MEDQ  D:  10/06/2005  T:  10/07/2005  Job:  [id number]   cc:   Telford Nab, R.N.  [address]  [place], [place] [address]

## 2010-09-18 ENCOUNTER — Other Ambulatory Visit: Payer: Self-pay | Admitting: Gynecologic Oncology

## 2010-09-18 ENCOUNTER — Ambulatory Visit: Payer: Medicare HMO | Attending: Gynecologic Oncology | Admitting: Gynecologic Oncology

## 2010-09-18 DIAGNOSIS — Z923 Personal history of irradiation: Secondary | ICD-10-CM | POA: Insufficient documentation

## 2010-09-18 DIAGNOSIS — I10 Essential (primary) hypertension: Secondary | ICD-10-CM | POA: Insufficient documentation

## 2010-09-18 DIAGNOSIS — Z8541 Personal history of malignant neoplasm of cervix uteri: Secondary | ICD-10-CM | POA: Insufficient documentation

## 2010-09-18 DIAGNOSIS — Z9221 Personal history of antineoplastic chemotherapy: Secondary | ICD-10-CM | POA: Insufficient documentation

## 2010-09-18 DIAGNOSIS — Z09 Encounter for follow-up examination after completed treatment for conditions other than malignant neoplasm: Secondary | ICD-10-CM | POA: Insufficient documentation

## 2010-09-19 NOTE — Consult Note (Signed)
  [name], [name] NO.:  [id number]  MEDICAL RECORD NO.:  [id number]  LOCATION:  GYN                          FACILITY:  [facility]  PHYSICIAN:  Laurette Schimke, MD     DATE OF BIRTH:  [birth year]  DATE OF CONSULTATION:09/18/2010 DATE OF DISCHARGE:                                CONSULTATION   REASON FOR VISIT:  History of cervical cancer.  HISTORY OF PRESENT ILLNESS:  This is an 77 year old who underwent an anterior exenteration with an ileal conduit for recurrent cervical cancer by Dr. [name].  This procedure was performed at [facility] in June [facility].  Final pathology was notable for 1-mm margin.  She did not receive adjuvant radiation or chemotherapy and has been without any evidence of disease since.  PAST MEDICAL HISTORY:  Hypertension and cervical cancer.  ALLERGIES:  No known drug allergies.  SOCIAL HISTORY:  She is a widow and nonsmoker.  Lives with her son.  She was hospitalized for 4 days with a small bowel obstruction in May 2011.  REVIEW OF SYSTEMS:  No nausea, vomiting, fever, chills.  No changes in weight.  No vaginal bleeding or rectal bleeding.  She reports that her urine is foul-smelling and appears concentrated.  No flank pain, cough, hemoptysis.  PHYSICAL EXAMINATION:  GENERAL:  Well-developed thin female in no acute distress. VITAL SIGNS:  Blood pressure 158/72, pulse 74, temperature 97.3. CHEST:  Clear to auscultation. LYMPH NODES:  No cervical, subclavicular, or inguinal adenopathy. ABDOMEN:  Soft and nontender.  Urostomy pink bag with yellow urine. BACK:  No CVA tenderness. PELVIC:  Examination, foreshortened vagina without any lesions. RECTAL:  Good anal sphincter tone without any masses.  IMPRESSION:  [name] is now 32 years' since her total anterior exenteration with ileal conduit.  She asked to be discharged from the practice and I am happy to oblige.     Laurette Schimke, MD     WB/MEDQ  D:  09/18/2010  T:   09/19/2010  Job:  [id number]  cc:   Gaspar Garbe, M.D. Fax: [phone]  Telford Nab, R.N. [address] [place], [place] [address]  Electronically Signed by Laurette Schimke MD on 09/19/2010 08:56:24 AM

## 2010-09-20 LAB — URINE CULTURE: Culture  Setup Time: 201208310044

## 2010-12-30 ENCOUNTER — Other Ambulatory Visit: Payer: Self-pay | Admitting: Internal Medicine

## 2010-12-30 DIAGNOSIS — Z1231 Encounter for screening mammogram for malignant neoplasm of breast: Secondary | ICD-10-CM

## 2011-02-09 ENCOUNTER — Ambulatory Visit: Payer: Medicare HMO

## 2011-02-23 ENCOUNTER — Ambulatory Visit
Admission: RE | Admit: 2011-02-23 | Discharge: 2011-02-23 | Disposition: A | Discharge status: Home / Self Care | Payer: Medicare HMO | Source: Ambulatory Visit | Attending: Internal Medicine | Admitting: Internal Medicine

## 2011-02-23 DIAGNOSIS — Z1231 Encounter for screening mammogram for malignant neoplasm of breast: Secondary | ICD-10-CM

## 2011-07-01 ENCOUNTER — Ambulatory Visit: Payer: Medicare HMO | Attending: Internal Medicine | Admitting: *Deleted

## 2011-07-01 DIAGNOSIS — M6281 Muscle weakness (generalized): Secondary | ICD-10-CM | POA: Insufficient documentation

## 2011-07-01 DIAGNOSIS — IMO0001 Reserved for inherently not codable concepts without codable children: Secondary | ICD-10-CM | POA: Insufficient documentation

## 2011-07-01 DIAGNOSIS — M25649 Stiffness of unspecified hand, not elsewhere classified: Secondary | ICD-10-CM | POA: Insufficient documentation

## 2011-07-01 DIAGNOSIS — R279 Unspecified lack of coordination: Secondary | ICD-10-CM | POA: Insufficient documentation

## 2011-07-14 ENCOUNTER — Ambulatory Visit: Payer: Medicare HMO | Admitting: Occupational Therapy

## 2011-07-17 ENCOUNTER — Ambulatory Visit: Payer: Medicare HMO | Admitting: Occupational Therapy

## 2011-07-21 ENCOUNTER — Ambulatory Visit: Payer: Medicare HMO | Attending: Internal Medicine | Admitting: Occupational Therapy

## 2011-07-21 DIAGNOSIS — M6281 Muscle weakness (generalized): Secondary | ICD-10-CM | POA: Insufficient documentation

## 2011-07-21 DIAGNOSIS — IMO0001 Reserved for inherently not codable concepts without codable children: Secondary | ICD-10-CM | POA: Insufficient documentation

## 2011-07-21 DIAGNOSIS — R279 Unspecified lack of coordination: Secondary | ICD-10-CM | POA: Insufficient documentation

## 2011-07-21 DIAGNOSIS — M25649 Stiffness of unspecified hand, not elsewhere classified: Secondary | ICD-10-CM | POA: Insufficient documentation

## 2011-07-28 ENCOUNTER — Ambulatory Visit: Payer: Medicare HMO | Admitting: Occupational Therapy

## 2011-07-30 ENCOUNTER — Ambulatory Visit: Payer: Medicare HMO | Admitting: Occupational Therapy

## 2011-08-03 ENCOUNTER — Ambulatory Visit: Payer: Medicare HMO | Admitting: Occupational Therapy

## 2011-08-05 ENCOUNTER — Encounter: Payer: Medicare HMO | Admitting: Occupational Therapy

## 2012-02-11 ENCOUNTER — Other Ambulatory Visit: Payer: Self-pay | Admitting: Internal Medicine

## 2012-02-11 DIAGNOSIS — Z1231 Encounter for screening mammogram for malignant neoplasm of breast: Secondary | ICD-10-CM

## 2012-03-17 ENCOUNTER — Ambulatory Visit
Admission: RE | Admit: 2012-03-17 | Discharge: 2012-03-17 | Disposition: A | Discharge status: Home / Self Care | Payer: Medicare HMO | Source: Ambulatory Visit | Attending: Internal Medicine | Admitting: Internal Medicine

## 2012-10-01 ENCOUNTER — Encounter (HOSPITAL_COMMUNITY): Payer: Self-pay | Admitting: Emergency Medicine

## 2012-10-01 ENCOUNTER — Emergency Department (HOSPITAL_COMMUNITY): Payer: Medicare HMO

## 2012-10-01 ENCOUNTER — Inpatient Hospital Stay (HOSPITAL_COMMUNITY)
Admission: EM | Admit: 2012-10-01 | Discharge: 2012-10-04 | DRG: 641 | Disposition: A | Discharge status: Home / Self Care | Payer: Medicare HMO | Attending: Internal Medicine | Admitting: Internal Medicine

## 2012-10-01 ENCOUNTER — Inpatient Hospital Stay (HOSPITAL_COMMUNITY): Payer: Medicare HMO

## 2012-10-01 ENCOUNTER — Emergency Department (HOSPITAL_COMMUNITY)
Admission: EM | Admit: 2012-10-01 | Discharge: 2012-10-01 | Disposition: A | Discharge status: Home / Self Care | Payer: Medicare HMO | Source: Home / Self Care | Attending: Family Medicine | Admitting: Family Medicine

## 2012-10-01 DIAGNOSIS — M810 Age-related osteoporosis without current pathological fracture: Secondary | ICD-10-CM | POA: Diagnosis present

## 2012-10-01 DIAGNOSIS — R109 Unspecified abdominal pain: Secondary | ICD-10-CM

## 2012-10-01 DIAGNOSIS — H409 Unspecified glaucoma: Secondary | ICD-10-CM | POA: Insufficient documentation

## 2012-10-01 DIAGNOSIS — N184 Chronic kidney disease, stage 4 (severe): Secondary | ICD-10-CM | POA: Diagnosis present

## 2012-10-01 DIAGNOSIS — N179 Acute kidney failure, unspecified: Secondary | ICD-10-CM

## 2012-10-01 DIAGNOSIS — Z681 Body mass index (BMI) 19 or less, adult: Secondary | ICD-10-CM

## 2012-10-01 DIAGNOSIS — E871 Hypo-osmolality and hyponatremia: Principal | ICD-10-CM | POA: Diagnosis present

## 2012-10-01 DIAGNOSIS — K922 Gastrointestinal hemorrhage, unspecified: Secondary | ICD-10-CM

## 2012-10-01 DIAGNOSIS — R7402 Elevation of levels of lactic acid dehydrogenase (LDH): Secondary | ICD-10-CM

## 2012-10-01 DIAGNOSIS — R627 Adult failure to thrive: Secondary | ICD-10-CM | POA: Diagnosis present

## 2012-10-01 DIAGNOSIS — Z8551 Personal history of malignant neoplasm of bladder: Secondary | ICD-10-CM

## 2012-10-01 DIAGNOSIS — E876 Hypokalemia: Secondary | ICD-10-CM | POA: Diagnosis present

## 2012-10-01 DIAGNOSIS — Z906 Acquired absence of other parts of urinary tract: Secondary | ICD-10-CM

## 2012-10-01 DIAGNOSIS — Z9350 Unspecified cystostomy status: Secondary | ICD-10-CM

## 2012-10-01 DIAGNOSIS — D631 Anemia in chronic kidney disease: Secondary | ICD-10-CM | POA: Diagnosis present

## 2012-10-01 DIAGNOSIS — C679 Malignant neoplasm of bladder, unspecified: Secondary | ICD-10-CM | POA: Diagnosis not present

## 2012-10-01 DIAGNOSIS — K5900 Constipation, unspecified: Secondary | ICD-10-CM | POA: Diagnosis present

## 2012-10-01 DIAGNOSIS — Z8541 Personal history of malignant neoplasm of cervix uteri: Secondary | ICD-10-CM

## 2012-10-01 DIAGNOSIS — I10 Essential (primary) hypertension: Secondary | ICD-10-CM | POA: Diagnosis present

## 2012-10-01 DIAGNOSIS — K8689 Other specified diseases of pancreas: Secondary | ICD-10-CM | POA: Diagnosis present

## 2012-10-01 DIAGNOSIS — I129 Hypertensive chronic kidney disease with stage 1 through stage 4 chronic kidney disease, or unspecified chronic kidney disease: Secondary | ICD-10-CM | POA: Diagnosis present

## 2012-10-01 DIAGNOSIS — Z936 Other artificial openings of urinary tract status: Secondary | ICD-10-CM

## 2012-10-01 DIAGNOSIS — K649 Unspecified hemorrhoids: Secondary | ICD-10-CM | POA: Diagnosis present

## 2012-10-01 DIAGNOSIS — M1990 Unspecified osteoarthritis, unspecified site: Secondary | ICD-10-CM | POA: Diagnosis present

## 2012-10-01 DIAGNOSIS — E860 Dehydration: Secondary | ICD-10-CM

## 2012-10-01 HISTORY — DX: Essential (primary) hypertension: I10

## 2012-10-01 LAB — CG4 I-STAT (LACTIC ACID): Lactic Acid, Venous: 2.53 mmol/L — ABNORMAL HIGH (ref 0.5–2.2)

## 2012-10-01 LAB — COMPREHENSIVE METABOLIC PANEL
AST: 51 U/L — ABNORMAL HIGH (ref 0–37)
Albumin: 4.1 g/dL (ref 3.5–5.2)
BUN: 50 mg/dL — ABNORMAL HIGH (ref 6–23)
Calcium: 10.3 mg/dL (ref 8.4–10.5)
Chloride: 77 mEq/L — ABNORMAL LOW (ref 96–112)
Creatinine, Ser: 1.64 mg/dL — ABNORMAL HIGH (ref 0.50–1.10)
GFR calc non Af Amer: 26 mL/min — ABNORMAL LOW (ref >90)
Total Bilirubin: 0.4 mg/dL (ref 0.3–1.2)

## 2012-10-01 LAB — URINALYSIS, ROUTINE W REFLEX MICROSCOPIC
Glucose, UA: NEGATIVE mg/dL
Protein, ur: 100 mg/dL — AB
Specific Gravity, Urine: 1.01 (ref 1.005–1.030)
Urobilinogen, UA: 0.2 mg/dL (ref 0.0–1.0)

## 2012-10-01 LAB — CBC WITH DIFFERENTIAL/PLATELET
Basophils Absolute: 0 10*3/uL (ref 0.0–0.1)
Basophils Relative: 0 % (ref 0–1)
Eosinophils Absolute: 0 10*3/uL (ref 0.0–0.7)
Eosinophils Relative: 0 % (ref 0–5)
MCH: 29.2 pg (ref 26.0–34.0)
MCV: 81.6 fL (ref 78.0–100.0)
Neutrophils Relative %: 68 % (ref 43–77)
Platelets: 196 10*3/uL (ref 150–400)
RDW: 12.8 % (ref 11.5–15.5)

## 2012-10-01 LAB — POCT I-STAT, CHEM 8
BUN: 48 mg/dL — ABNORMAL HIGH (ref 6–23)
Chloride: 81 mEq/L — ABNORMAL LOW (ref 96–112)
HCT: 38 % (ref 36.0–46.0)
Sodium: 122 mEq/L — ABNORMAL LOW (ref 135–145)
TCO2: 31 mmol/L (ref 0–100)

## 2012-10-01 LAB — URINE MICROSCOPIC-ADD ON

## 2012-10-01 LAB — LIPASE, BLOOD: Lipase: 84 U/L — ABNORMAL HIGH (ref 11–59)

## 2012-10-01 LAB — OCCULT BLOOD, POC DEVICE: Fecal Occult Bld: POSITIVE — AB

## 2012-10-01 MED ORDER — SODIUM CHLORIDE 0.9 % IV BOLUS (SEPSIS)
1000.0000 mL | Freq: Once | INTRAVENOUS | Status: AC
  Administered 2012-10-01: 1000 mL via INTRAVENOUS

## 2012-10-01 MED ORDER — WHITE PETROLATUM GEL
Status: AC
  Administered 2012-10-02: 04:00:00
  Filled 2012-10-01: qty 5

## 2012-10-01 MED ORDER — IOHEXOL 300 MG/ML  SOLN
25.0000 mL | INTRAMUSCULAR | Status: AC
  Administered 2012-10-01: 25 mL via ORAL

## 2012-10-01 MED ORDER — HYDRALAZINE HCL 25 MG PO TABS
25.0000 mg | ORAL_TABLET | Freq: Four times a day (QID) | ORAL | Status: DC | PRN
  Administered 2012-10-01: 25 mg via ORAL
  Filled 2012-10-01: qty 1

## 2012-10-01 MED ORDER — AMLODIPINE BESYLATE 10 MG PO TABS
10.0000 mg | ORAL_TABLET | Freq: Every day | ORAL | Status: DC
  Administered 2012-10-01 – 2012-10-04 (×4): 10 mg via ORAL
  Filled 2012-10-01 (×4): qty 1

## 2012-10-01 MED ORDER — POLYETHYLENE GLYCOL 3350 17 G PO PACK
17.0000 g | PACK | Freq: Every day | ORAL | Status: DC
  Filled 2012-10-01 (×2): qty 1

## 2012-10-01 MED ORDER — CLONIDINE HCL 0.2 MG PO TABS
0.2000 mg | ORAL_TABLET | Freq: Three times a day (TID) | ORAL | Status: DC
  Administered 2012-10-01 – 2012-10-02 (×3): 0.2 mg via ORAL
  Filled 2012-10-01 (×7): qty 1

## 2012-10-01 NOTE — ED Provider Notes (Signed)
[name] is a 79 y.o. female who presents to Urgent Care today for fatigue constipation and decreased appetite present for the last several days. Patient's Lasix was recently doubled and she was recently started on Creon for possible pancreatic insufficiency. She notes some constipation and tried taking a laxative which helped yesterday. She denies any fevers chills trouble breathing. She does note some nausea but denies any vomiting or diarrhea. She notes that her lower extremity edema has resolved with doubling her Lasix over the past several days.    Past Medical History  Diagnosis Date  . Hypertension    history of colostomy from partial bowel resection from colon cancer in 1981  History  Substance Use Topics  . Smoking status: Never Smoker   . Smokeless tobacco: Not on file  . Alcohol Use: No   ROS as above Medications reviewed. No current facility-administered medications for this encounter.   Current Outpatient Prescriptions  Medication Sig Dispense Refill  . Calcium Carbonate (CALCIUM 600 PO) Take by mouth.      . cloNIDine HCl (KAPVAY) 0.1 MG TB12 ER tablet Take by mouth.      . furosemide (LASIX) 20 MG tablet Take 20 mg by mouth.      . losartan-hydrochlorothiazide (HYZAAR) 100-25 MG per tablet Take 1 tablet by mouth daily.      . Pancrelipase, Lip-Prot-Amyl, (CREON PO) Take by mouth.        Exam:  BP 239/80  Pulse 90  Temp(Src) 98.7 F (37.1 C) (Oral)  Resp 18  SpO2 100% Gen: Well NAD HEENT: EOMI,  MMM Lungs: CTABL Nl WOB Heart: RRR no MRG Abd: NABS, NT, ND Exts: Non edematous BL  LE, warm and well perfused.   Results for orders placed during the hospital encounter of 10/01/12 (from the past 24 hour(s))  POCT I-STAT, CHEM 8     Status: Abnormal   Collection Time    10/01/12  4:39 PM      Result Value Range   Sodium 122 (*) 135 - 145 mEq/L   Potassium 3.1 (*) 3.5 - 5.1 mEq/L   Chloride 81 (*) 96 - 112 mEq/L   BUN 48 (*) 6 - 23 mg/dL   Creatinine, Ser  4.09 (*) 0.50 - 1.10 mg/dL   Glucose, Bld 811 (*) 70 - 99 mg/dL   Calcium, Ion 9.14  7.82 - 1.30 mmol/L   TCO2 31  0 - 100 mmol/L   Hemoglobin 12.9  12.0 - 15.0 g/dL   HCT 95.6  21.3 - 08.6 %   No results found.  Assessment and Plan: 79 y.o. female with acute kidney injury likely related to increasing Lasix. Loss of appetite may be related to increasing BUN. Plan to change your patient to the emergency room for evaluation and management of this mild acute kidney injury.  Discussed warning signs or symptoms. Please see discharge instructions. Patient expresses understanding.      Rodolph Bong, MD 10/01/12 1700

## 2012-10-01 NOTE — ED Notes (Signed)
Via daughter... Pt c/o abd pain onset 08/12 Reports pt just started taking Creon but believes its making her constipated Sxs also include: decreased appetite but she is drinking fluids Had a laxative yest w/a bowel movent  Denies: fevers, cold sxs, v/n/d Alert w/no signs of acute distress.

## 2012-10-01 NOTE — H&P (Signed)
PCP:   Crystal Garbe, MD   Chief Complaint:  Hyponatremia, abd pain, heme positive stool  HPI: Patient is a 79 year old AA female well known to me from practice at [facility].  I have been on call this weekend but had not heard from [name] since last month.  She went to Urgent Care earlier today. Complaining of fatigue, constipation and poor appetite.  She has pancreatic insufficiency and has been on Creon for about a year due to initial weight loss.  Her last OV she was complaining of ankle edema and she was told that she could take an extra Lasix on occasion but to keep her ankles elevated.  She notes that her edema is gone.  Prior workup for CHF showed mild elevation in BNP of 300's in May.Her baseline renal function is a CrCl of around 30 with a Cr between 1.8-2.0.  Her Urgent Care eval led to her being transported to the ER for her BUN of 48 and Cr of 2.0, but most importantly, a sodium of 122 (as the above renal indices are her baseline).  She also has a history of bladder cancer with an ileal conduit and has not had complications with infection for some time.  ER eval shows similar labs with an elevated lactate.  CT with oral contrast has been ordered (no IV due to her CKD).  She was also hypokalemic, but this has been replaced in the ER as well.  She had a BM in the ER and it was tested as heme positive, but her CBC is at baseline.  Currently she feels a bit better with hydration.  Her BP is also SBP >200 at this time as well, no meds have been given by the ER yet to address this.  Review of Systems:  Positive for constipation and some abdominal tenderness, generalized malaise and fatigue.  No lateralizing signs or focal weakness.  Denies fever/chills and denies chest pain or respiratory difficulties.  Review otherwise negative on 12 point system.  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 Authorizing Provider  alendronate (FOSAMAX) 70 MG tablet Take 70 mg by mouth every 7 (seven) days. On Mondays 08/30/12  Yes Historical Provider, MD  aliskiren (TEKTURNA) 150 MG tablet Take 150 mg by mouth daily.   Yes Historical Provider, MD  brimonidine (ALPHAGAN) 0.15 % ophthalmic solution Place 1 drop into both eyes at bedtime.   Yes Historical Provider, MD  Calcium Carbonate-Vitamin D (CALCIUM 600 + D PO) Take 600 mg by mouth daily.   Yes Historical Provider, MD  cloNIDine (CATAPRES) 0.3 MG tablet Take 0.3 mg by mouth at bedtime.  06/24/12  Yes Historical Provider, MD  furosemide (LASIX) 20 MG tablet Take 40 mg by mouth daily.    Yes Historical Provider, MD  losartan-hydrochlorothiazide (HYZAAR) 100-25 MG per tablet Take 1 tablet by mouth daily.   Yes Historical Provider, MD  Olopatadine HCl (PATADAY) 0.2 % SOLN Apply 1 drop to eye at bedtime.   Yes Historical Provider, MD  Polyethyl Glycol-Propyl Glycol (SYSTANE  OP) Apply 1 drop to eye at bedtime.   Yes Historical Provider, MD  Polyvinyl Alcohol-Povidone (REFRESH OP) Apply 1 drop to eye daily as needed (dry eyes).   Yes Historical Provider, MD  Creon 36000 units TID AC  Allergies:  No Known Allergies  Physical Exam: Filed Vitals:   10/01/12 1736 10/01/12 1815  BP: 207/79 193/74  Pulse: 90 73  Temp: 98.2 F (36.8 C) 98.6 F (37 C)  TempSrc: Oral Oral  Resp: 16 26  Height: 5\' 2"  (1.575 m)   Weight: 43.262 kg (95 lb 6 oz)   SpO2: 100% 100%   General appearance: alert, cooperative and appears stated age Head:  Normocephalic, without obvious abnormality, atraumatic Eyes: conjunctivae/corneas clear. PERRL, EOM's intact.  Nose: Nares normal. Septum midline. Mucosa normal. No drainage or sinus tenderness. Throat: lips, mucosa, and tongue normal; teeth and gums normal Neck: no adenopathy, no carotid bruit, no JVD and thyroid not enlarged, symmetric, no tenderness/mass/nodules Resp: clear to auscultation bilaterally Cardio: regular rate and rhythm, S1, S2 normal, no murmur, click, rub or gallop GI: soft, mild diffuse tenderness; bowel sounds normal; no masses,  no organomegaly, heme positive in ER following BM Extremities: extremities normal, atraumatic, no cyanosis or edema Pulses: 2+ and symmetric Lymph nodes: Cervical adenopathy: no cervical lymphadenopathy Neurologic: Alert and oriented X 3, normal strength and tone. Normal symmetric reflexes.   Labs on Admission:   Recent Labs  10/01/12 1639 10/01/12 1846  NA 122* 120*  K 3.1* 3.8  CL 81* 77*  CO2  --  29  GLUCOSE 176* 130*  BUN 48* 50*  CREATININE 2.00* 1.64*  CALCIUM  --  10.3    Recent Labs  10/01/12 1846  AST 51*  ALT 17  ALKPHOS 40  BILITOT 0.4  PROT 8.3  ALBUMIN 4.1    Recent Labs  10/01/12 1846  LIPASE 84*    Recent Labs  10/01/12 1639 10/01/12 1935  WBC  --  6.4  NEUTROABS  --  4.4  HGB 12.9 11.4*  HCT 38.0 31.9*  MCV  --  81.6  PLT  --  196   Radiological Exams on Admission: Dg Abd Acute [name]/chest  10/01/2012   CLINICAL DATA:  Abdominal pain.  EXAM: ACUTE ABDOMEN SERIES (ABDOMEN 2 VIEW & CHEST 1 VIEW)  COMPARISON:  06/17/2009  FINDINGS: There is hyperinflation of the lungs compatible with COPD. Heart is normal size. No effusions or focal airspace opacity.  Extensive postsurgical changes scratch head extensive surgical clips throughout the lower abdomen and pelvis. Left lower quadrant ostomy noted. No evidence of bowel obstruction or free air. No organomegaly. Degenerative changes and scoliosis in the lumbar  spine.  IMPRESSION: No evidence of bowel obstruction or free air.  COPD.   Electronically Signed   By: Charlett Nose M.D.   On: 10/01/2012 20:14   CT ABD/PELVIS with PO contrast currently pending in ER.  Assessment/Plan Hyponatremia- Likely due to diuretics and age.  Her Cr is at it's baseline as is her ratio from prior labs.  WIl hydrate with IV fluids Hypokalemia- Replaced in ER, will maintain in fluids CKD Stage 4- She is actually at her baseline creatinine but due to her being dry, will hold diuretics as well as renin and ARB meds.  SHe has had a difficult time tolerating other PO BP meds, but will need to use as below HYN Urgency-  Will get prn Hydralazine with her clonidine dosed now as tid.  WIll also add amlodipine, but  this has been constipating to her as well as caused edema, which I expect to return with the med and hydration.  Hopefully she can resume ARB once her electrolytes improve. 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  Ostomy care per nursing protocol. Constipation.  Had BM in ER, will continue Colace and the CT contrast will likely help this as well. Will get PT/OT evals as well due to her age. She has required assistance with a walker in the past years but she is still quite spry for a woman of her advanced age. Given her [facility], she may be a good candidate for [facility] as well for transition.   [name] 10/01/2012, 8:25 PM

## 2012-10-01 NOTE — ED Provider Notes (Signed)
CSN: [id number]     Arrival date & time 10/01/12  1727 History   First MD Initiated Contact with Patient 10/01/12 1800     Chief Complaint  Patient presents with  . Abdominal Pain  . Constipation   (Consider location/radiation/quality/duration/timing/severity/associated sxs/prior Treatment) HPI [name] is a very pleasant 79 y.o. female who presents to the emergency department from urgent care for abdominal pain and acute kidney injury.  The patient was seen at the urgent care today as she has not had a bowel movement since yesterday was complaining of diffuse abdominal pain.  The patient has a past medical history of colon resection status post colon cancer as well as presennt urostomy.  Hurting to the medical chart the patient recently had an increase in her dose of Lasix.  Initial labs show a large elevation in her BUN and creatinine.  Patient complains of diffuse abdominal pain.  Patient was given a laxative last night without a bowel movement.  Patient states that her pain feels like "I just need to go to the bathroom."  She complains of feeling of tenesmus without bowel movement.  Denies nausea, vomiting.  Notes state that she was also recently started on Creon for pancreatic insufficiency.  Patient is hypertensive today   Past Medical History  Diagnosis Date  . Hypertension    Past Surgical History  Procedure Laterality Date  . Colon surgery     No family history on file. History  Substance Use Topics  . Smoking status: Never Smoker   . Smokeless tobacco: Not on file  . Alcohol Use: No   OB History   Grav Para Term Preterm Abortions TAB SAB Ect Mult Living                 Review of Systems  Constitutional: Negative for fever, chills, activity change, appetite change and fatigue.  HENT: Negative for trouble swallowing.   Respiratory: Negative for shortness of breath.   Cardiovascular: Negative for chest pain.  Gastrointestinal: Positive for abdominal pain and  constipation. Negative for nausea, vomiting, diarrhea and blood in stool.  Genitourinary: Negative for hematuria.  Musculoskeletal: Negative for myalgias and arthralgias.  Skin: Negative for rash.  Neurological: Negative for numbness.  Psychiatric/Behavioral: Negative for confusion.  All other systems reviewed and are negative.    Allergies  Review of patient's allergies indicates no known allergies.  Home Medications   Current Outpatient Rx  Name  Route  Sig  Dispense  Refill  . alendronate (FOSAMAX) 70 MG tablet   Oral   Take 70 mg by mouth every 7 (seven) days. On Mondays         . aliskiren (TEKTURNA) 150 MG tablet   Oral   Take 150 mg by mouth daily.         . Calcium Carbonate-Vitamin D (CALCIUM 600 + D PO)   Oral   Take 600 mg by mouth daily.         . cloNIDine (CATAPRES) 0.3 MG tablet   Oral   Take 0.3 mg by mouth at bedtime.          . furosemide (LASIX) 20 MG tablet   Oral   Take 40 mg by mouth daily.          Marland Kitchen losartan-hydrochlorothiazide (HYZAAR) 100-25 MG per tablet   Oral   Take 1 tablet by mouth daily.         . Olopatadine HCl (PATADAY) 0.2 % SOLN   Ophthalmic  Apply 1 drop to eye at bedtime.         Marland Kitchen PRESCRIPTION MEDICATION      Eye drops - daughter will call back with more information          BP 193/74  Pulse 73  Temp(Src) 98.6 F (37 C) (Oral)  Resp 26  Ht 5\' 2"  (1.575 m)  Wt 95 lb 6 oz (43.262 kg)  BMI 17.44 kg/m2  SpO2 100% Physical Exam  Nursing note and vitals reviewed. Constitutional:  Thin, pleasant, elderly female in no acute distress  HENT:  Head: Normocephalic and atraumatic.  Abdominal: Soft. She exhibits no distension and no mass. There is no tenderness. There is no rebound and no guarding.  Urostomy bag present on the left abdominal wall  Genitourinary:  Digital Rectal Exam reveals sphincter with decreased tone. No external hemorrhoids. No masses or fissures. Firm stool in the rectum, stool is  actively leaking from rectum.   Psychiatric: Her behavior is normal. Judgment and thought content normal.    ED Course  Procedures (including critical care time) Labs Review Labs Reviewed  COMPREHENSIVE METABOLIC PANEL - Abnormal; Notable for the following:    Sodium 120 (*)    Chloride 77 (*)    Glucose, Bld 130 (*)    BUN 50 (*)    Creatinine, Ser 1.64 (*)    AST 51 (*)    GFR calc non Af Amer 26 (*)    GFR calc Af Amer 30 (*)    All other components within normal limits  URINALYSIS, ROUTINE W REFLEX MICROSCOPIC - Abnormal; Notable for the following:    Protein, ur 100 (*)    Leukocytes, UA LARGE (*)    All other components within normal limits  LIPASE, BLOOD - Abnormal; Notable for the following:    Lipase 84 (*)    All other components within normal limits  CBC WITH DIFFERENTIAL - Abnormal; Notable for the following:    Hemoglobin 11.4 (*)    HCT 31.9 (*)    All other components within normal limits  URINE MICROSCOPIC-ADD ON - Abnormal; Notable for the following:    Bacteria, UA MANY (*)    All other components within normal limits  CG4 I-STAT (LACTIC ACID) - Abnormal; Notable for the following:    Lactic Acid, Venous 2.53 (*)    All other components within normal limits  OCCULT BLOOD, POC DEVICE - Abnormal; Notable for the following:    Fecal Occult Bld POSITIVE (*)    All other components within normal limits  URINE CULTURE  CBC WITH DIFFERENTIAL   Imaging Review No results found.  MDM   1. Abdominal pain   2. Dehydration   3. Acute kidney injury   4. Hyponatremia   5. Elevated serum lactate dehydrogenase   6. Hypertension   7. GI bleed    Patient with abdominal pain. Her abdominal exam is fairly benign.  The patient does not appear to be in acute pain and does not request any pain medications.  The patient is having an active bowel movement during rectal exam.  Her labs show acute kidney injury, hyponatremia, elevated lactate.  She has a positive fecal  occult blood, lipase is elevated.  Patient continues to be hypertensive although she states she did take all of her medications as prescribed.  This may be secondary to recent  increase in her Lasix.  He said has pyuria on urinalysis however she does have a urostomy. With abdominal pain  and elevated lactate there is concern for ischemic bowel, however, her pain is unimpressive.  8:09 PM BP 193/74  Pulse 73  Temp(Src) 98.6 F (37 C) (Oral)  Resp 26  Ht 5\' 2"  (1.575 m)  Wt 95 lb 6 oz (43.262 kg)  BMI 17.44 kg/m2  SpO2 100% I spoke with Dr. Wylene Simmer who will admit the patient. He requests CT with oral contrast.    Arthor Captain, PA-C 10/01/12 2011

## 2012-10-01 NOTE — ED Notes (Signed)
Family reports that pt c/o abdominal pain onset yesterday. Family also reports constipation. Last BM was yesterday and hard. Pt denies n/v.

## 2012-10-01 NOTE — ED Provider Notes (Addendum)
Medical screening examination/treatment/procedure(s) were conducted as a shared visit with non-physician practitioner(s) and myself.  I personally evaluated the patient during the encounter  Pt with diffuse abd pain, mildly tender on exam, tried laxative earlier, after exam by PAC, pt is now having a BM.  No fever, no N/V.  Will reassess pain after BM.  Labs are pending.  Pt is HTN, but no obvious new symptoms to suggest end organ failure.     7:52 PM Labs reveal + GI bleed with + occult stool, also electrolyte derangement with Na of 120, renal insuff and she also remains quite hypertensive.  Hyponatremic hypovolemia.  Acute abd series is pending.  Although ischemic bowel is consideration, pt's pain is not out of proportion to exam, likely lactic acid can be serially monitored as IVF rehydration is given to help correct renal insuff and hyponatremia.  Regardless, given age and medical comorbidities, will likely need medical assessment first if not simply medical admission.  PCP is Dr. Wylene Simmer, will need to call [facility]    Gavin Pound. Oletta Lamas, MD 10/01/12 1954  Gavin Pound. Oletta Lamas, MD 10/01/12 1610

## 2012-10-01 NOTE — ED Notes (Signed)
Joycelyn Das (daughter) [phone]

## 2012-10-02 ENCOUNTER — Encounter (HOSPITAL_COMMUNITY): Payer: Self-pay | Admitting: *Deleted

## 2012-10-02 LAB — VITAMIN B12: Vitamin B-12: 1027 pg/mL — ABNORMAL HIGH (ref 211–911)

## 2012-10-02 LAB — IRON AND TIBC: Iron: 74 ug/dL (ref 42–135)

## 2012-10-02 LAB — COMPREHENSIVE METABOLIC PANEL
Albumin: 3 g/dL — ABNORMAL LOW (ref 3.5–5.2)
BUN: 45 mg/dL — ABNORMAL HIGH (ref 6–23)
Chloride: 83 mEq/L — ABNORMAL LOW (ref 96–112)
Creatinine, Ser: 1.48 mg/dL — ABNORMAL HIGH (ref 0.50–1.10)
Total Bilirubin: 0.4 mg/dL (ref 0.3–1.2)
Total Protein: 6 g/dL (ref 6.0–8.3)

## 2012-10-02 LAB — BASIC METABOLIC PANEL
BUN: 41 mg/dL — ABNORMAL HIGH (ref 6–23)
Creatinine, Ser: 1.43 mg/dL — ABNORMAL HIGH (ref 0.50–1.10)
GFR calc Af Amer: 36 mL/min — ABNORMAL LOW (ref >90)
GFR calc non Af Amer: 31 mL/min — ABNORMAL LOW (ref >90)
Potassium: 3.8 mEq/L (ref 3.5–5.1)

## 2012-10-02 LAB — RETICULOCYTES
Retic Count, Absolute: 27.5 10*3/uL (ref 19.0–186.0)
Retic Ct Pct: 0.9 % (ref 0.4–3.1)

## 2012-10-02 LAB — HEMOGLOBIN AND HEMATOCRIT, BLOOD
HCT: 25.4 % — ABNORMAL LOW (ref 36.0–46.0)
Hemoglobin: 9.2 g/dL — ABNORMAL LOW (ref 12.0–15.0)

## 2012-10-02 LAB — CBC
HCT: 23.9 % — ABNORMAL LOW (ref 36.0–46.0)
MCHC: 36.8 g/dL — ABNORMAL HIGH (ref 30.0–36.0)
MCV: 81.6 fL (ref 78.0–100.0)
RDW: 13 % (ref 11.5–15.5)

## 2012-10-02 LAB — LACTIC ACID, PLASMA: Lactic Acid, Venous: 0.6 mmol/L (ref 0.5–2.2)

## 2012-10-02 LAB — FERRITIN: Ferritin: 198 ng/mL (ref 10–291)

## 2012-10-02 LAB — OCCULT BLOOD X 1 CARD TO LAB, STOOL: Fecal Occult Bld: NEGATIVE

## 2012-10-02 MED ORDER — POLYETHYL GLYCOL-PROPYL GLYCOL 0.4-0.3 % OP SOLN: 1.0000 [drp] | Freq: Two times a day (BID) | OPHTHALMIC | Status: DC

## 2012-10-02 MED ORDER — ACETAMINOPHEN 325 MG PO TABS: 650.0000 mg | ORAL_TABLET | Freq: Four times a day (QID) | ORAL | Status: DC | PRN

## 2012-10-02 MED ORDER — CALCIUM CARBONATE-VITAMIN D 600-400 MG-UNIT PO TABS: 1.0000 | ORAL_TABLET | Freq: Two times a day (BID) | ORAL | Status: DC

## 2012-10-02 MED ORDER — POLYVINYL ALCOHOL 1.4 % OP SOLN
1.0000 [drp] | Freq: Two times a day (BID) | OPHTHALMIC | Status: DC
  Administered 2012-10-02 – 2012-10-03 (×4): 1 [drp] via OPHTHALMIC
  Filled 2012-10-02: qty 15

## 2012-10-02 MED ORDER — PANCRELIPASE (LIP-PROT-AMYL) 12000-38000 UNITS PO CPEP
2.0000 | ORAL_CAPSULE | Freq: Three times a day (TID) | ORAL | Status: DC
  Administered 2012-10-02 – 2012-10-04 (×7): 2 via ORAL
  Filled 2012-10-02 (×10): qty 2

## 2012-10-02 MED ORDER — OLOPATADINE HCL 0.1 % OP SOLN
1.0000 [drp] | Freq: Two times a day (BID) | OPHTHALMIC | Status: DC
  Administered 2012-10-02 – 2012-10-03 (×4): 1 [drp] via OPHTHALMIC
  Filled 2012-10-02: qty 5

## 2012-10-02 MED ORDER — ZOLPIDEM TARTRATE 5 MG PO TABS: 5.0000 mg | ORAL_TABLET | Freq: Every evening | ORAL | Status: DC | PRN

## 2012-10-02 MED ORDER — BRIMONIDINE TARTRATE 0.2 % OP SOLN
1.0000 [drp] | Freq: Every day | OPHTHALMIC | Status: DC
  Administered 2012-10-02 – 2012-10-03 (×2): 1 [drp] via OPHTHALMIC
  Filled 2012-10-02: qty 5

## 2012-10-02 MED ORDER — ONDANSETRON HCL 4 MG/2ML IJ SOLN: 4.0000 mg | Freq: Four times a day (QID) | INTRAMUSCULAR | Status: DC | PRN

## 2012-10-02 MED ORDER — ACETAMINOPHEN 650 MG RE SUPP: 650.0000 mg | Freq: Four times a day (QID) | RECTAL | Status: DC | PRN

## 2012-10-02 MED ORDER — ONDANSETRON HCL 4 MG PO TABS: 4.0000 mg | ORAL_TABLET | Freq: Four times a day (QID) | ORAL | Status: DC | PRN

## 2012-10-02 MED ORDER — CALCIUM CARBONATE-VITAMIN D 500-200 MG-UNIT PO TABS
1.0000 | ORAL_TABLET | Freq: Two times a day (BID) | ORAL | Status: DC
  Administered 2012-10-02 – 2012-10-04 (×5): 1 via ORAL
  Filled 2012-10-02 (×6): qty 1

## 2012-10-02 MED ORDER — ENOXAPARIN SODIUM 30 MG/0.3ML ~~LOC~~ SOLN
30.0000 mg | SUBCUTANEOUS | Status: DC
  Administered 2012-10-02: 30 mg via SUBCUTANEOUS
  Filled 2012-10-02 (×2): qty 0.3

## 2012-10-02 MED ORDER — POTASSIUM CHLORIDE CRYS ER 20 MEQ PO TBCR
40.0000 meq | EXTENDED_RELEASE_TABLET | Freq: Two times a day (BID) | ORAL | Status: DC
  Administered 2012-10-02 (×2): 40 meq via ORAL
  Filled 2012-10-02 (×4): qty 2

## 2012-10-02 MED ORDER — DOCUSATE SODIUM 100 MG PO CAPS
100.0000 mg | ORAL_CAPSULE | Freq: Two times a day (BID) | ORAL | Status: DC
  Administered 2012-10-02 – 2012-10-04 (×3): 100 mg via ORAL
  Filled 2012-10-02 (×4): qty 1

## 2012-10-02 MED ORDER — POTASSIUM CHLORIDE IN NACL 20-0.9 MEQ/L-% IV SOLN
INTRAVENOUS | Status: DC
  Administered 2012-10-02 (×2): via INTRAVENOUS
  Filled 2012-10-02 (×4): qty 1000

## 2012-10-02 NOTE — Progress Notes (Signed)
Subjective: Doing well this AM.  Eating her breakfast without difficulty.  Belly feels better and she states that she is stronger.  Reviewed her labs with her.  Daughter not present (plays piano at church and needed to be there this AM).  Objective: Vital signs in last 24 hours: Temp:  [98.2 F (36.8 C)-98.7 F (37.1 C)] 98.3 F (36.8 C) (09/14 0600) Pulse Rate:  [61-90] 61 (09/14 0600) Resp:  [16-26] 20 (09/14 0600) BP: (130-239)/(46-95) 130/46 mmHg (09/14 0600) SpO2:  [99 %-100 %] 100 % (09/14 0600) Weight:  [41.776 kg (92 lb 1.6 oz)-43.262 kg (95 lb 6 oz)] 41.776 kg (92 lb 1.6 oz) (09/13 2340) Weight change:  Last BM Date: 10/02/12  Intake/Output from previous day: 09/13 0701 - 09/14 0700 In: 619 [I.V.:619] Out: 902 [Urine:900; Stool:2] Intake/Output this shift:   General appearance: alert, cooperative and appears stated age  Neck: no adenopathy, no carotid bruit, no JVD and thyroid not enlarged, symmetric, no tenderness/mass/nodules  Resp: clear to auscultation bilaterally  Cardio: regular rate and rhythm, S1, S2 normal, no murmur, click, rub or gallop  GI: soft, mild diffuse tenderness; bowel sounds normal; no masses, no organomegaly, heme positive in ER following BM  Extremities: extremities normal, atraumatic, no cyanosis or edema  Pulses: 2+ and symmetric  Neurologic: Alert and oriented X 3, normal strength and tone. Normal symmetric reflexes.   Lab Results:  Recent Labs  10/01/12 1935 10/02/12 0535  WBC 6.4 4.9  HGB 11.4* 8.8*  HCT 31.9* 23.9*  PLT 196 186   BMET  Recent Labs  10/01/12 1846 10/02/12 0535  NA 120* 118*  K 3.8 2.8*  CL 77* 83*  CO2 29 26  GLUCOSE 130* 115*  BUN 50* 45*  CREATININE 1.64* 1.48*  CALCIUM 10.3 8.3*    Studies/Results: Ct Abdomen Pelvis Wo Contrast  10/01/2012   CLINICAL DATA:  Constipation, decreased appetite.  Abdominal pain.  EXAM: CT ABDOMEN AND PELVIS WITHOUT CONTRAST  TECHNIQUE: Multidetector CT imaging of the  abdomen and pelvis was performed following the standard protocol without intravenous contrast.  COMPARISON:  Plain films earlier today. CT 06/15/2009.  FINDINGS: Mild cardiomegaly. Linear scarring in the lung bases. No effusions.  Liver, spleen, gallbladder have an unremarkable unenhanced appearance. Right kidney grossly unremarkable. There is mild left hydronephrosis. The left kidney is atrophic with severe cortical thinning suggesting this is a chronic process. This is stable since 2011.  Large stool burden in the distal descending colon and rectosigmoid colon. Mild gaseous distention of the colon. Small bowel is decompressed and grossly unremarkable. Prior hysterectomy. No adnexal masses. No visible free fluid or free air. Aorta and iliac vessels are heavily calcified, non aneurysmal.  No acute bony abnormality. Severe scoliosis and degenerative changes in the lumbar spine.  IMPRESSION: Large stool burden in the distal colon.  Stable chronic hydronephrosis of the left kidney with associated atrophy and cortical thinning.  No acute findings.  Chronic changes as above.   Electronically Signed   By: Charlett Nose M.D.   On: 10/01/2012 22:35   Dg Abd Acute W/chest  10/01/2012   CLINICAL DATA:  Abdominal pain.  EXAM: ACUTE ABDOMEN SERIES (ABDOMEN 2 VIEW & CHEST 1 VIEW)  COMPARISON:  06/17/2009  FINDINGS: There is hyperinflation of the lungs compatible with COPD. Heart is normal size. No effusions or focal airspace opacity.  Extensive postsurgical changes scratch head extensive surgical clips throughout the lower abdomen and pelvis. Left lower quadrant ostomy noted. No evidence of bowel  obstruction or free air. No organomegaly. Degenerative changes and scoliosis in the lumbar spine.  IMPRESSION: No evidence of bowel obstruction or free air.  COPD.   Electronically Signed   By: Charlett Nose M.D.   On: 10/01/2012 20:14    Medications:  I have reviewed the patient's current medications. Scheduled: . amLODipine  10  mg Oral Daily  . brimonidine  1 drop Both Eyes QHS  . calcium-vitamin D  1 tablet Oral BID  . cloNIDine  0.2 mg Oral TID  . docusate sodium  100 mg Oral BID  . enoxaparin (LOVENOX) injection  30 mg Subcutaneous Q24H  . lipase/protease/amylase  2 capsule Oral TID AC  . olopatadine  1 drop Both Eyes BID  . polyethylene glycol  17 g Oral Daily  . polyvinyl alcohol  1 drop Both Eyes BID  . potassium chloride  40 mEq Oral BID  . white petrolatum       Continuous: . 0.9 % NaCl with KCl 20 mEq / L 100 mL/hr at 10/02/12 0546   ZOX:[id number], acetaminophen, hydrALAZINE, ondansetron (ZOFRAN) IV, ondansetron, zolpidem  Assessment/Plan: Hyponatremia- Likely due to diuretics and age. Her Cr is at it's baseline as is her ratio from prior labs. WIl hydrate with IV fluids  118 today, restricting free water, nurse to make sure family not bringing her water as well. Hypokalemia- Replaced in ER, will maintain in fluids  Give more orally as dropped again overnight to 2.8 this AM. CKD Stage 4- She is actually at her baseline creatinine but due to her being dry, will hold diuretics as well as renin and ARB meds. SHe has had a difficult time tolerating other PO BP meds, but will need to use as below  HYN Urgency- Will get prn Hydralazine with her clonidine dosed now as tid. WIll also add amlodipine, but this has been constipating to her as well as caused edema, which I expect to return with the med and hydration. Hopefully she can resume ARB once her electrolytes improve.  BP good this AM. Ileal conduit- All cultures will be "dirty" by nature of this, but she has no sxs c/w pouchitis at this time. Ostomy care per nursing protocol.  Constipation. Had BM in ER, will continue Colace and the CT contrast will likely help this as well. WIll check guaiacs as well due to anemia Will get PT/OT evals as well due to her age. She has required assistance with a walker in the past years but she is still quite spry for a  woman of her advanced age. Has OA. Anemia-  Panel and recheck at 4pm.  She is not showing signs of loss so it is likely due to fluid balance. Given her [facility], she may be a good candidate for [facility] as well for transition.   LOS: 1 day   TISOVEC,RICHARD W 10/02/2012, 8:22 AM

## 2012-10-02 NOTE — Evaluation (Signed)
Physical Therapy Evaluation Patient Details Name: [name] MRN: [id number] DOB: 03/24/1919 Today's Date: 10/02/2012 Time: 4098-1191 PT Time Calculation (min): 24 min  PT Assessment / Plan / Recommendation History of Present Illness  Patient is a 79 yo female admitted with abd pain, nausea/vomiting and diarrhea.    Clinical Impression  Patient presents with problems listed below.  Will benefit from acute PT to maximize independence prior to discharge.  Recommend HHPT at discharge for continued therapy, and RW for use at home.    PT Assessment  Patient needs continued PT services    Follow Up Recommendations  Home health PT;Supervision - Intermittent    Does the patient have the potential to tolerate intense rehabilitation      Barriers to Discharge Decreased caregiver support Patient reports she is alone at times.      Equipment Recommendations  Rolling walker with 5" wheels    Recommendations for Other Services     Frequency Min 3X/week    Precautions / Restrictions Precautions Precautions: Fall Restrictions Weight Bearing Restrictions: No   Pertinent Vitals/Pain       Mobility  Bed Mobility Bed Mobility: Rolling Right;Rolling Left;Right Sidelying to Sit;Sit to Supine Rolling Right: 4: Min assist;With rail Rolling Left: 4: Min assist;With rail Right Sidelying to Sit: 4: Min assist;With rails;HOB elevated Sit to Supine: 4: Min assist;HOB elevated Details for Bed Mobility Assistance: Verbal cues for technique. Assist to raise trunk from bed to sitting.  Assist to return LE's to bed to move to supine. Transfers Transfers: Sit to Stand;Stand to Sit Sit to Stand: 4: Min assist;With upper extremity assist;From bed Stand to Sit: 4: Min assist;With upper extremity assist;To bed Details for Transfer Assistance: Verbal cues for hand placement.  Patient leaning posteriorly initially - improved with time. Ambulation/Gait Ambulation/Gait Assistance: 4: Min  assist Ambulation Distance (Feet): 4 Feet Assistive device: Rolling walker Ambulation/Gait Assistance Details: Verbal cues to stand upright.  Patient fearful of having diarrhea, so distracted from gait and unwilling to move far from bed.  Required assist for balance with posterior lean. Gait Pattern: Step-through pattern;Decreased step length - right;Decreased step length - left;Decreased stride length;Shuffle;Trunk flexed    Exercises     PT Diagnosis: Difficulty walking;Generalized weakness  PT Problem List: Decreased strength;Decreased activity tolerance;Decreased balance;Decreased mobility;Decreased knowledge of use of DME;Impaired sensation PT Treatment Interventions: DME instruction;Gait training;Functional mobility training;Balance training;Patient/family education     PT Goals(Current goals can be found in the care plan section) Acute Rehab PT Goals Patient Stated Goal: To go home soon PT Goal Formulation: With patient Time For Goal Achievement: 10/09/12 Potential to Achieve Goals: Good  Visit Information  Last PT Received On: 10/02/12 Assistance Needed: +1 History of Present Illness: Patient is a 79 yo female admitted with abd pain, nausea/vomiting and diarrhea.         Prior Functioning  Home Living Family/patient expects to be discharged to:: Private residence Living Arrangements: Children (States son lives with her) Available Help at Discharge: Family;Available PRN/intermittently Type of Home: House Home Access: Stairs to enter [employer] of Steps: 1 Entrance Stairs-Rails: None Home Layout: One level Home Equipment: Shower seat Prior Function Level of Independence: Independent;Needs assistance (Patient reports she does not use a RW) ADL's / Homemaking Assistance Needed: Daughter assists with housekeeping and meals Communication Communication: No difficulties Dominant Hand: Right    Cognition  Cognition Arousal/Alertness: Awake/alert Behavior  During Therapy: WFL for tasks assessed/performed Overall Cognitive Status: Within Functional Limits for tasks assessed  Extremity/Trunk Assessment Upper Extremity Assessment Upper Extremity Assessment: Generalized weakness Lower Extremity Assessment Lower Extremity Assessment: Generalized weakness (Decrease to light touch lower legs) Cervical / Trunk Assessment Cervical / Trunk Assessment: Kyphotic   Balance Balance Balance Assessed: Yes Static Sitting Balance Static Sitting - Balance Support: No upper extremity supported;Feet supported Static Sitting - Level of Assistance: 5: Stand by assistance Static Sitting - Comment/# of Minutes: 5 Static Standing Balance Static Standing - Balance Support: Bilateral upper extremity supported Static Standing - Level of Assistance: 4: Min assist Static Standing - Comment/# of Minutes: 2 minutes.  Patient with posterior lean, improved with time  End of Session PT - End of Session Equipment Utilized During Treatment: Gait belt Activity Tolerance: Patient limited by fatigue Patient left: in bed;with call bell/phone within reach Nurse Communication: Mobility status  GP     Vena Austria 10/02/2012, 3:03 PM Durenda Hurt. Renaldo Fiddler, [facility] Acute Rehab Services Pager [phone]

## 2012-10-03 LAB — URINE CULTURE: Colony Count: >=100000

## 2012-10-03 LAB — BASIC METABOLIC PANEL
BUN: 39 mg/dL — ABNORMAL HIGH (ref 6–23)
BUN: 37 mg/dL — ABNORMAL HIGH (ref 6–23)
CO2: 23 mEq/L (ref 19–32)
CO2: 23 mEq/L (ref 19–32)
Calcium: 8.2 mg/dL — ABNORMAL LOW (ref 8.4–10.5)
Chloride: 100 mEq/L (ref 96–112)
Chloride: 97 mEq/L (ref 96–112)
Creatinine, Ser: 1.57 mg/dL — ABNORMAL HIGH (ref 0.50–1.10)
Creatinine, Ser: 1.74 mg/dL — ABNORMAL HIGH (ref 0.50–1.10)
Glucose, Bld: 109 mg/dL — ABNORMAL HIGH (ref 70–99)
Potassium: 5.2 mEq/L — ABNORMAL HIGH (ref 3.5–5.1)

## 2012-10-03 LAB — CBC
HCT: 22.5 % — ABNORMAL LOW (ref 36.0–46.0)
Hemoglobin: 7.8 g/dL — ABNORMAL LOW (ref 12.0–15.0)
MCH: 28.9 pg (ref 26.0–34.0)
MCV: 83.3 fL (ref 78.0–100.0)
Platelets: 175 10*3/uL (ref 150–400)
RBC: 2.7 MIL/uL — ABNORMAL LOW (ref 3.87–5.11)
WBC: 4.9 10*3/uL (ref 4.0–10.5)

## 2012-10-03 MED ORDER — LOSARTAN POTASSIUM 50 MG PO TABS
100.0000 mg | ORAL_TABLET | Freq: Every day | ORAL | Status: DC
  Administered 2012-10-03 – 2012-10-04 (×2): 100 mg via ORAL
  Filled 2012-10-03: qty 2
  Filled 2012-10-03: qty 1
  Filled 2012-10-03: qty 2

## 2012-10-03 MED ORDER — FUROSEMIDE 20 MG PO TABS
20.0000 mg | ORAL_TABLET | Freq: Once | ORAL | Status: AC
Start: 2012-10-03 — End: 2012-10-03
  Administered 2012-10-03: 20 mg via ORAL
  Filled 2012-10-03: qty 1

## 2012-10-03 MED ORDER — PANTOPRAZOLE SODIUM 40 MG PO TBEC
40.0000 mg | DELAYED_RELEASE_TABLET | Freq: Every day | ORAL | Status: DC
  Administered 2012-10-03 – 2012-10-04 (×2): 40 mg via ORAL
  Filled 2012-10-03 (×3): qty 1

## 2012-10-03 MED ORDER — LOSARTAN POTASSIUM-HCTZ 100-25 MG PO TABS: 1.0000 | ORAL_TABLET | Freq: Every day | ORAL | Status: DC

## 2012-10-03 MED ORDER — HYDROCHLOROTHIAZIDE 25 MG PO TABS
25.0000 mg | ORAL_TABLET | Freq: Every day | ORAL | Status: DC
  Administered 2012-10-03 – 2012-10-04 (×2): 25 mg via ORAL
  Filled 2012-10-03 (×3): qty 1

## 2012-10-03 NOTE — Evaluation (Signed)
Occupational Therapy Evaluation Patient Details Name: [name] MRN: [id number] DOB: 1919-07-02 Today's Date: 10/03/2012  Time: 7829-5621 [phone]) Pt required x3 attempts this AM- prolonged time to eat. Pt observed during meal. Daughter feeding patient at times to help encourage PO intake OT Time Calculation (min): 26 min  OT Assessment / Plan / Recommendation History of present illness Patient is a 79 yo female admitted with abd pain, nausea/vomiting and diarrhea.     Clinical Impression   Pt with incr weakness and fall risk. Pt unaware of deficits and requesting to ambulate without (A). Pt could benefit from DME however pt is resistant to its use. Pt with posterior LOB x2 during session. Pt reaching for unsafe environmental supports. Pt has family assistance for d/c home and daughter was CNA in past. OT to follow acutely.    OT Assessment  Patient needs continued OT Services    Follow Up Recommendations  Home health OT    Barriers to Discharge      Equipment Recommendations  None recommended by OT    Recommendations for Other Services    Frequency  Min 2X/week    Precautions / Restrictions Precautions Precautions: Fall   Pertinent Vitals/Pain No pain reported    ADL  Eating/Feeding: Set up Where Assessed - Eating/Feeding: Bed level (required > than 1 hour to eat meal) Grooming: Wash/dry hands;Minimal assistance Where Assessed - Grooming: Supported standing Upper Body Bathing: Chest;Right arm;Left arm;Abdomen;Minimal assistance Where Assessed - Upper Body Bathing: Supported sitting Lower Body Bathing: Minimal assistance Where Assessed - Lower Body Bathing: Supported sit to stand Lower Body Dressing: Minimal assistance Where Assessed - Lower Body Dressing: Supported sit to Pharmacist, hospital: Minimal Dentist Method: Sit to Barista: Regular height toilet Toileting - Clothing Manipulation and Hygiene: Minimal  assistance Where Assessed - Glass blower/designer Manipulation and Hygiene: Sit to stand from 3-in-1 or toilet Equipment Used: Gait belt Transfers/Ambulation Related to ADLs: Pt requesting to walk alone and ambulate to bathroom without (A). daughter educating patient that she is not allowed to move without (A) from staff. Pt compliate after daughters education ADL Comments: pt at baseline able to complete ADls without (A). pt weaker that this time and demonstrates balalcne deficits. pt falling posteriorly and reaching for unsafe environmental supports. Pt scissoring gait and lob with head turns. pt needed daughters input to follow therapist instructions. Pt very independent in nature. Pt will have home support for d/c and agreeable to [facility]    OT Diagnosis: Generalized weakness;Cognitive deficits  OT Problem List: Decreased strength;Decreased activity tolerance;Impaired balance (sitting and/or standing);Decreased cognition;Decreased safety awareness;Decreased knowledge of use of DME or AE;Cardiopulmonary status limiting activity OT Treatment Interventions: Self-care/ADL training;Therapeutic exercise;DME and/or AE instruction;Therapeutic activities;Cognitive remediation/compensation;Patient/family education;Balance training   OT Goals(Current goals can be found in the care plan section) Acute Rehab OT Goals Patient Stated Goal: To go home soon OT Goal Formulation: With patient Time For Goal Achievement: 10/17/12 Potential to Achieve Goals: Good ADL Goals Pt Will Perform Grooming: with modified independence Pt Will Perform Upper Body Bathing: with modified independence;sitting Pt Will Perform Lower Body Bathing: with modified independence;sit to/from stand Pt Will Transfer to Toilet: with modified independence;regular height toilet Additional ADL Goal #1: Pt will complete bed mobility MOD I with HOB 15 degrees  Visit Information  Last OT Received On: 10/03/12 Assistance Needed: +1 History of  Present Illness: Patient is a 79 yo female admitted with abd pain, nausea/vomiting and diarrhea.  Prior Functioning     Home Living Family/patient expects to be discharged to:: Private residence Living Arrangements: Other (Comment) (son lives in home but has bad knees) Available Help at Discharge: Family;Available PRN/intermittently Type of Home: House Home Access: Stairs to enter [employer] of Steps: 1 Entrance Stairs-Rails: None Home Layout: One level Home Equipment: Shower seat Additional Comments: pt was independent with all aspect of ADLs. pt even prepared side items such as rice on the stove per daughter. Pt has family (A) and has most meals prepared so cooking is not a task that she must complete Prior Function Level of Independence: Independent ADL's / Homemaking Assistance Needed: Daughter assists with housekeeping and meals Communication Communication: No difficulties Dominant Hand: Right         Vision/Perception Vision - History Baseline Vision: Wears glasses all the time Vision - Assessment Vision Assessment: Vision not tested   Cognition  Cognition Arousal/Alertness: Awake/alert Behavior During Therapy: WFL for tasks assessed/performed Overall Cognitive Status: Within Functional Limits for tasks assessed Memory: Decreased short-term memory ("oh yeah I've seen you before")    Extremity/Trunk Assessment Upper Extremity Assessment Upper Extremity Assessment: Generalized weakness Lower Extremity Assessment Lower Extremity Assessment: Defer to PT evaluation Cervical / Trunk Assessment Cervical / Trunk Assessment: Kyphotic     Mobility Bed Mobility Bed Mobility: Rolling Right;Right Sidelying to Sit;Supine to Sit;Sitting - Scoot to [place] of Bed Rolling Right: 4: Min assist Right Sidelying to Sit: 4: Min assist;HOB elevated Supine to Sit: 4: Min assist;HOB elevated Sitting - Scoot to [place] of Bed: 4: Min assist Details for Bed Mobility  Assistance: pt requesting HOB elevated and HOB allowed 15 degrees due to patient states I have by big pillows at home. Pt needed incr time and effort to complete bed mobility Transfers Transfers: Sit to Stand;Stand to Sit Sit to Stand: 4: Min assist;With upper extremity assist;From bed Stand to Sit: 4: Min assist;With upper extremity assist;To chair/3-in-1 Details for Transfer Assistance: cues for hand placement and safety. pt with posterior lean with sit<>Stand initially     Exercise     Balance High Level Balance High Level Balance Activites: Turns;Head turns High Level Balance Comments: pt with narrowed base of support with turning and LOB with head turns. Pt cutting eyes when asked to rotate neck toward the right side to assess balance. Pt not attempting again after lOB. pt is very eager to d/c home asap. Pt states "this hospital toook all my engery I have been here too long"   End of Session OT - End of Session Activity Tolerance: Patient tolerated treatment well Patient left: in chair;with bed alarm set;with family/visitor present Nurse Communication: Mobility status;Precautions  GO     Harolyn Rutherford 10/03/2012, 10:17 AM Pager: [phone]

## 2012-10-03 NOTE — Progress Notes (Signed)
Subjective: Daughter at bedside.  Laxatives worked well for her.  No blood seen in BM and not tarry, but sore soreness from her bottom, using barrier cream.  Eating well and feels better.  Objective: Vital signs in last 24 hours: Temp:  [97.9 F (36.6 C)-99 F (37.2 C)] 97.9 F (36.6 C) (09/15 0431) Pulse Rate:  [50-63] 50 (09/15 0431) Resp:  [16] 16 (09/15 0431) BP: (102-169)/(48-52) 122/48 mmHg (09/15 0431) SpO2:  [100 %] 100 % (09/15 0431) Weight:  [42.4 kg (93 lb 7.6 oz)] 42.4 kg (93 lb 7.6 oz) (09/15 0431) Weight change: -0.862 kg (-1 lb 14.4 oz) Last BM Date: 10/03/12  Intake/Output from previous day: 09/14 0701 - 09/15 0700 In: 2470 [P.O.:120; I.V.:2350] Out: 1850 [Urine:1850] Intake/Output this shift:  General appearance: alert, cooperative and appears stated age  Neck: no adenopathy, no carotid bruit, no JVD and thyroid not enlarged, symmetric, no tenderness/mass/nodules  Resp: clear to auscultation bilaterally  Cardio: regular rate and rhythm, S1, S2 normal, no murmur, click, rub or gallop  GI: soft, mild diffuse tenderness; bowel sounds normal; no masses, no organomegaly, heme positive in ER following BM  Extremities: extremities normal, atraumatic, no cyanosis or edema  Pulses: 2+ and symmetric  Neurologic: Alert and oriented X 3, normal strength and tone. Normal symmetric reflexes.   Lab Results:  Recent Labs  10/02/12 0535 10/02/12 1530 10/03/12 0530  WBC 4.9  --  4.9  HGB 8.8* 9.2* 7.8*  HCT 23.9* 25.4* 22.5*  PLT 186  --  175   BMET  Recent Labs  10/02/12 1530 10/03/12 0530  NA 124* 128*  K 3.8 5.8*  CL 88* 100  CO2 27 23  GLUCOSE 103* 109*  BUN 41* 39*  CREATININE 1.43* 1.57*  CALCIUM 8.7 8.2*    Studies/Results: Ct Abdomen Pelvis Wo Contrast  10/01/2012   CLINICAL DATA:  Constipation, decreased appetite.  Abdominal pain.  EXAM: CT ABDOMEN AND PELVIS WITHOUT CONTRAST  TECHNIQUE: Multidetector CT imaging of the abdomen and pelvis was  performed following the standard protocol without intravenous contrast.  COMPARISON:  Plain films earlier today. CT 06/15/2009.  FINDINGS: Mild cardiomegaly. Linear scarring in the lung bases. No effusions.  Liver, spleen, gallbladder have an unremarkable unenhanced appearance. Right kidney grossly unremarkable. There is mild left hydronephrosis. The left kidney is atrophic with severe cortical thinning suggesting this is a chronic process. This is stable since 2011.  Large stool burden in the distal descending colon and rectosigmoid colon. Mild gaseous distention of the colon. Small bowel is decompressed and grossly unremarkable. Prior hysterectomy. No adnexal masses. No visible free fluid or free air. Aorta and iliac vessels are heavily calcified, non aneurysmal.  No acute bony abnormality. Severe scoliosis and degenerative changes in the lumbar spine.  IMPRESSION: Large stool burden in the distal colon.  Stable chronic hydronephrosis of the left kidney with associated atrophy and cortical thinning.  No acute findings.  Chronic changes as above.   Electronically Signed   By: Charlett Nose M.D.   On: 10/01/2012 22:35   Dg Abd Acute W/chest  10/01/2012   CLINICAL DATA:  Abdominal pain.  EXAM: ACUTE ABDOMEN SERIES (ABDOMEN 2 VIEW & CHEST 1 VIEW)  COMPARISON:  06/17/2009  FINDINGS: There is hyperinflation of the lungs compatible with COPD. Heart is normal size. No effusions or focal airspace opacity.  Extensive postsurgical changes scratch head extensive surgical clips throughout the lower abdomen and pelvis. Left lower quadrant ostomy noted. No evidence of bowel obstruction  or free air. No organomegaly. Degenerative changes and scoliosis in the lumbar spine.  IMPRESSION: No evidence of bowel obstruction or free air.  COPD.   Electronically Signed   By: Charlett Nose M.D.   On: 10/01/2012 20:14    Medications:  I have reviewed the patient's current medications. Scheduled: . amLODipine  10 mg Oral Daily  .  brimonidine  1 drop Both Eyes QHS  . calcium-vitamin D  1 tablet Oral BID  . docusate sodium  100 mg Oral BID  . furosemide  20 mg Oral Once  . lipase/protease/amylase  2 capsule Oral TID AC  . losartan-hydrochlorothiazide  1 tablet Oral Daily  . olopatadine  1 drop Both Eyes BID  . pantoprazole  40 mg Oral Daily  . polyethylene glycol  17 g Oral Daily  . polyvinyl alcohol  1 drop Both Eyes BID   Continuous:  ION:[id number], acetaminophen, hydrALAZINE, ondansetron (ZOFRAN) IV, ondansetron, zolpidem  Assessment/Plan: Hyponatremia- Likely due to diuretics and age. Her Cr is at it's baseline as is her ratio from prior labs. Sodium 128 today, so will hold IV fluids,  Hypokalemia- Now hyper, will give dose of IV lasix and fluids with potassium held, was low yesterday AM. CKD Stage 4- She is actually at her baseline.  Resume Hyzaar today for BP. HTN Urgency-Resolved.  WIll see if she can come off Clonidine and use Hyzaar and Amlodipine instead for BP control.  Hopefully no issues with rebound or may need to resume. Ileal conduit- All cultures will be "dirty" by nature of this, but she has no sxs c/w pouchitis at this time. Ostomy care per nursing protocol.  Constipation. Resolved.  WIll continue stool softener and barrier cream, hold Miralax.  Discussed with patient and daughter importance of taking it at home if no BM x 2 days. Will get PT/OT for home per recs. Anemia- Panel and recheck at 4pm again today.  Her H+H was stable until this AM's labs, but I feel it is likely due to fluid balance as no sign of GI blood loss and yesterday's guaiac was negative .Was positive in ER but had some blood due to constipation seen, but not gross amount.  SHe really doesn't want to pursue EGD or colonoscopy as procedure unless necessary.  CT negative for mass. Given her [facility], she may be a good candidate for [facility] as well for transition.   LOS: 2 days   Vanessa Kampf W 10/03/2012, 8:31 AM

## 2012-10-03 NOTE — Progress Notes (Signed)
Patient evaluated for community based chronic disease management services with [facility] Care Management Program as a benefit of patient's [employer]. Services have been accepted and consents received.  Patient will receive a post discharge transition of care call and will be evaluated for monthly home visits for assessments and disease process education. Spoke with patient and daughter/POA [name] Cell: [phone] or Home: [phone]) at bedside to explain [facility] Care Management services.  [name] is the primary contact.  Patient would like to receive her Ensure supplements from [facility] if eligible.  Her daughter expressed difficulty with adjusting to some medication changes that were recommended prior to admission.  Specifically, her out of pocket cost for Creon was estimated to be $300.  They were provided samples and the medication was later discontinued.  Also her Lasix was increased to 40mg  daily.  This concerned the family because they did not fully understand the reason for the increase and how it would affect the patient.  Left contact information and [facility] literature at bedside. Made inpatient Case Manager aware that [facility] Care Management following. Of note, [facility] Care Management services does not replace or interfere with any services that are arranged by inpatient case management or social work.  For additional questions or referrals please contact [name] BSN RN [facility] Liaison at [phone].

## 2012-10-03 NOTE — Consult Note (Signed)
WOC ostomy consult  Stoma type/location: Urostomy Stomal assessment/size: Flush/  7/8 inch  Peristomal assessment: Intact Output clear yellow Ostomy pouching: 2pc.  Education provided:  Patient uses Catering manager at home.  Precut 7/8 inch convex 1 piece system.  Has used this for years and does not desire to use formulary urostomy pouch.  Daughter present at bedside and states that she will go home and get the specific pouch patient uses.  Indicates she does not wish for [place] to order a pouch.  Patient independent with urostomy care and does not wish for a nighttime drainage bag.   If patient pouch leaks, would order [facility] [place] [phone], cut to fit convex urostomy pouch.   Will not follow at this time.  Please re-consult if needed.  Maple Hudson RN BSN [facility] Pager [phone]

## 2012-10-03 NOTE — Progress Notes (Signed)
Physical Therapy Treatment Patient Details Name: [name] MRN: [id number] DOB: July 14, 1919 Today's Date: 10/03/2012 Time: 8657-8469 PT Time Calculation (min): 25 min  PT Assessment / Plan / Recommendation  History of Present Illness Patient is a 79 yo female admitted with abd pain, nausea/vomiting and diarrhea.     PT Comments   Pt with fair rehab effort today. She was reluctant to participate with therapy at first but was encouraged by daughter. Pt was able to increase ambulation distance this session and used no AD, but did require some assist for balance, especially during turns. 1 LOB noted when standing at chair, and required assistance to control her descent to a seated position. Daughter then reminds pt how important it is to have help in the restroom because of her balance.  Follow Up Recommendations  Home health PT;Supervision - Intermittent     Does the patient have the potential to tolerate intense rehabilitation     Barriers to Discharge        Equipment Recommendations  Rolling walker with 5" wheels (or possibly SPC)    Recommendations for Other Services    Frequency Min 3X/week   Progress towards PT Goals Progress towards PT goals: Progressing toward goals  Plan Current plan remains appropriate    Precautions / Restrictions Precautions Precautions: Fall Restrictions Weight Bearing Restrictions: No   Pertinent Vitals/Pain Pt does not report any pain throughout session, however states that she is very fatigued at end of gait training.    Mobility  Bed Mobility Bed Mobility: Not assessed (Pt received up in recliner) Rolling Right: 4: Min assist Right Sidelying to Sit: 4: Min assist;HOB elevated Supine to Sit: 4: Min assist;HOB elevated Sitting - Scoot to [place] of Bed: 4: Min assist Details for Bed Mobility Assistance: pt requesting HOB elevated and HOB allowed 15 degrees due to patient states I have by big pillows at home. Pt needed incr time and effort to  complete bed mobility Transfers Transfers: Sit to Stand;Stand to Sit Sit to Stand: 4: Min guard;From chair/3-in-1;With armrests Stand to Sit: 4: Min assist;To chair/3-in-1;With armrests Details for Transfer Assistance: Assist for controlled descent to chair, and pt was cued for safety awareness and hand placement on seated surface prior to initiation of transfers. Ambulation/Gait Ambulation/Gait Assistance: 4: Min assist Ambulation Distance (Feet): 250 Feet Assistive device: None Ambulation/Gait Assistance Details: VC's for increased step/stride length, increased step height, and increased gait speed at different times throughout gait training. Pt was unable to make/refused to make any corrective changes. Gait Pattern: Step-through pattern;Decreased stride length;Decreased hip/knee flexion - right;Decreased hip/knee flexion - left;Narrow base of support Gait velocity: Decreased, especially towards end of gait training. Stairs: No    Exercises General Exercises - Lower Extremity Long Arc Quad: 10 reps;Both Hip Flexion/Marching: 15 reps;Both   PT Diagnosis:    PT Problem List:   PT Treatment Interventions:     PT Goals (current goals can now be found in the care plan section) Acute Rehab PT Goals Patient Stated Goal: To go home soon PT Goal Formulation: With patient Time For Goal Achievement: 10/09/12 Potential to Achieve Goals: Good  Visit Information  Last PT Received On: 10/03/12 Assistance Needed: +1 History of Present Illness: Patient is a 79 yo female admitted with abd pain, nausea/vomiting and diarrhea.      Subjective Data  Subjective: "I just walked a little while ago." Patient Stated Goal: To go home soon   Cognition  Cognition Arousal/Alertness: Awake/alert Behavior During Therapy: [facility]  for tasks assessed/performed Overall Cognitive Status: Within Functional Limits for tasks assessed Memory: Decreased short-term memory ("oh yeah I've seen you before")     Balance  Balance Balance Assessed: Yes Static Sitting Balance Static Sitting - Balance Support: No upper extremity supported;Feet supported Static Sitting - Level of Assistance: 6: Modified independent (Device/Increase time) Static Sitting - Comment/# of Minutes: 3 Static Standing Balance Static Standing - Balance Support: No upper extremity supported Static Standing - Level of Assistance: 4: Min assist Static Standing - Comment/# of Minutes: 2 minutes while therapist fixed gown, and pt had LOB back into the chair with min assist for controlled descent to the chair. High Level Balance High Level Balance Activites: Turns;Head turns High Level Balance Comments: Pt had little balance disturbances while performing this task due to not turning head enough to challenge her balance. When cued for increased ROM during head turns pt refused to make any corrective changes.  End of Session PT - End of Session Equipment Utilized During Treatment: Gait belt Activity Tolerance: Patient limited by fatigue Patient left: in chair;with call bell/phone within reach;with family/visitor present   GP     Ruthann Cancer 10/03/2012, 12:10 PM  Ruthann Cancer, PT Acute Rehabilitation Services

## 2012-10-04 LAB — CBC
HCT: 25 % — ABNORMAL LOW (ref 36.0–46.0)
Hemoglobin: 8.8 g/dL — ABNORMAL LOW (ref 12.0–15.0)
MCHC: 35.2 g/dL (ref 30.0–36.0)
MCV: 84.2 fL (ref 78.0–100.0)
RDW: 13.8 % (ref 11.5–15.5)

## 2012-10-04 LAB — BASIC METABOLIC PANEL
BUN: 36 mg/dL — ABNORMAL HIGH (ref 6–23)
Chloride: 98 mEq/L (ref 96–112)
Creatinine, Ser: 1.56 mg/dL — ABNORMAL HIGH (ref 0.50–1.10)
GFR calc Af Amer: 32 mL/min — ABNORMAL LOW (ref >90)
GFR calc non Af Amer: 28 mL/min — ABNORMAL LOW (ref >90)
Glucose, Bld: 86 mg/dL (ref 70–99)
Potassium: 4.6 mEq/L (ref 3.5–5.1)

## 2012-10-04 MED ORDER — CLONIDINE HCL 0.1 MG PO TABS: 0.1000 mg | ORAL_TABLET | Freq: Two times a day (BID) | ORAL | Status: DC

## 2012-10-04 MED ORDER — AMLODIPINE BESYLATE 10 MG PO TABS: 10.0000 mg | ORAL_TABLET | Freq: Every day | ORAL | Status: AC

## 2012-10-04 MED ORDER — PANCRELIPASE (LIP-PROT-AMYL) 12000-38000 UNITS PO CPEP: 2.0000 | ORAL_CAPSULE | Freq: Three times a day (TID) | ORAL | Status: DC

## 2012-10-04 MED ORDER — DSS 100 MG PO CAPS: 100.0000 mg | ORAL_CAPSULE | Freq: Two times a day (BID) | ORAL | Status: DC

## 2012-10-04 NOTE — Discharge Summary (Signed)
DISCHARGE SUMMARY  [name]  MR#: [id number]  DOB:1919-06-28  Date of Admission: 10/01/2012 Date of Discharge: 10/04/2012  Attending Physician:Cecilie Heidel W  Patient's WUJ:WJXBJYN,WGNFAOZ W, MD   Discharge Diagnoses: Principal Problem:   Hyponatremia- resolving Active Problems:   Hypokalemia- resolved   Hypertension   Osteoarthritis   Chronic kidney disease, stage 4, severely decreased GFR   Urinary bladder cancer, S/P ileal conduit   Pancreatic insufficiency   Constipation   Anemia of chronic disease Failure to thrive/general deconditioning  Glaucoma  Discharge Medications:   Medication List    STOP taking these medications       aliskiren 150 MG tablet  Commonly known as:  TEKTURNA     furosemide 20 MG tablet  Commonly known as:  LASIX      TAKE these medications       alendronate 70 MG tablet  Commonly known as:  FOSAMAX  Take 70 mg by mouth every 7 (seven) days. On Mondays     amLODipine 10 MG tablet  Commonly known as:  NORVASC  Take 1 tablet (10 mg total) by mouth daily.     brimonidine 0.15 % ophthalmic solution  Commonly known as:  ALPHAGAN  Place 1 drop into both eyes at bedtime.     CALCIUM 600 + D PO  Take 600 mg by mouth daily.     cloNIDine 0.1 MG tablet  Commonly known as:  CATAPRES  Take 1 tablet (0.1 mg total) by mouth 2 (two) times daily.     DSS 100 MG Caps  Take 100 mg by mouth 2 (two) times daily.     lipase/protease/amylase 30865 UNITS Cpep capsule  Commonly known as:  CREON-12/PANCREASE  Take 2 capsules by mouth 3 (three) times daily before meals.     losartan-hydrochlorothiazide 100-25 MG per tablet  Commonly known as:  HYZAAR  Take 1 tablet by mouth daily.     PATADAY 0.2 % Soln  Generic drug:  Olopatadine HCl  Apply 1 drop to eye at bedtime.     REFRESH OP  Apply 1 drop to eye daily as needed (dry eyes).     SYSTANE OP  Apply 1 drop to eye at bedtime.        Hospital Procedures: Ct Abdomen Pelvis Wo  Contrast  10/01/2012   CLINICAL DATA:  Constipation, decreased appetite.  Abdominal pain.  EXAM: CT ABDOMEN AND PELVIS WITHOUT CONTRAST  TECHNIQUE: Multidetector CT imaging of the abdomen and pelvis was performed following the standard protocol without intravenous contrast.  COMPARISON:  Plain films earlier today. CT 06/15/2009.  FINDINGS: Mild cardiomegaly. Linear scarring in the lung bases. No effusions.  Liver, spleen, gallbladder have an unremarkable unenhanced appearance. Right kidney grossly unremarkable. There is mild left hydronephrosis. The left kidney is atrophic with severe cortical thinning suggesting this is a chronic process. This is stable since 2011.  Large stool burden in the distal descending colon and rectosigmoid colon. Mild gaseous distention of the colon. Small bowel is decompressed and grossly unremarkable. Prior hysterectomy. No adnexal masses. No visible free fluid or free air. Aorta and iliac vessels are heavily calcified, non aneurysmal.  No acute bony abnormality. Severe scoliosis and degenerative changes in the lumbar spine.  IMPRESSION: Large stool burden in the distal colon.  Stable chronic hydronephrosis of the left kidney with associated atrophy and cortical thinning.  No acute findings.  Chronic changes as above.   Electronically Signed   By: Charlett Nose M.D.   On: 10/01/2012  22:35   Dg Abd Acute W/chest  10/01/2012   CLINICAL DATA:  Abdominal pain.  EXAM: ACUTE ABDOMEN SERIES (ABDOMEN 2 VIEW & CHEST 1 VIEW)  COMPARISON:  06/17/2009  FINDINGS: There is hyperinflation of the lungs compatible with COPD. Heart is normal size. No effusions or focal airspace opacity.  Extensive postsurgical changes scratch head extensive surgical clips throughout the lower abdomen and pelvis. Left lower quadrant ostomy noted. No evidence of bowel obstruction or free air. No organomegaly. Degenerative changes and scoliosis in the lumbar spine.  IMPRESSION: No evidence of bowel obstruction or free  air.  COPD.   Electronically Signed   By: Charlett Nose M.D.   On: 10/01/2012 20:14    History of Present Illness: Patient is a 79 year old female with pancreatic insufficiency, HTN, constipation and generalized weakness with poor appetite x 1 week and failure to thrive admitted with hyponatremia and abdominal pain as well as elevated lactate.  Hospital Course: [name] was admitted by myself for hyponatremia and abdominal pain.  CT was requested in the ER due to pain and she was found to have constipation mostly left sided of her colon.  Laxatives were given and she had relief.  Initial ER hemoccult was positive, but noted hemorrhoidal bleeding at that time.  Subsequent checks have been negative.  Her initial Na was 120, so her diuretics were held and she was given IV NS + potassium as she also was hypokalemic.  Labs show improvement at time of discharge to normal K+ and an Na of 130.  She also had initially high BP's.  ARB/Tekturna were held and Amlodipine was added.  She has had issues with it in the past due to ankle swelling but it did an excellent job of helping her BP.  An attempt was made to take her off Clonidine since it is likely worsening her constipation, but her BP was higher and it was restarted, albeit at a lower dose.   She feels well and is eating as well.  Has had contact with [facility] and recs for HHPT have been arranged.  She has preexisting f/u for next week and will have repeat labs done on Thursday to make sure that she is doping well.  Her Hyzaar has been resumed, but the Tekturna and Lasix are d/ced.  She had a noted anemia, but this is chronic and due to aggressive IV fluids.  No evidence of blood loss was seen.  Day of Discharge Exam BP 179/61  Pulse 73  Temp(Src) 98.6 F (37 C) (Oral)  Resp 16  Ht 5\' 2"  (1.575 m)  Wt 42.2 kg (93 lb 0.6 oz)  BMI 17.01 kg/m2  SpO2 97%  Physical Exam: General appearance: alert, cooperative and appears stated age  Neck: no adenopathy, no  carotid bruit, no JVD and thyroid not enlarged, symmetric, no tenderness/mass/nodules  Resp: clear to auscultation bilaterally  Cardio: regular rate and rhythm, S1, S2 normal, no murmur, click, rub or gallop  GI: soft, mild diffuse tenderness; bowel sounds normal; no masses, no organomegaly, heme positive in ER following BM  Extremities: extremities normal, atraumatic, no cyanosis or edema  Pulses: 2+ and symmetric  Neurologic: Alert and oriented X 3, normal strength and tone. Normal symmetric reflexes.   Discharge Labs:  Recent Labs  10/03/12 1720 10/04/12 0645  NA 128* 130*  K 5.2* 4.6  CL 97 98  CO2 23 22  GLUCOSE 102* 86  BUN 37* 36*  CREATININE 1.74* 1.56*  CALCIUM  9.0 8.8    Recent Labs  10/01/12 1846 10/02/12 0535  AST 51* 36  ALT 17 11  ALKPHOS 40 33*  BILITOT 0.4 0.4  PROT 8.3 6.0  ALBUMIN 4.1 3.0*    Recent Labs  10/01/12 1935  10/03/12 0530 10/03/12 1720 10/04/12 0645  WBC 6.4  < > 4.9  --  5.3  NEUTROABS 4.4  --   --   --   --   HGB 11.4*  < > 7.8* 9.4* 8.8*  HCT 31.9*  < > 22.5* 26.6* 25.0*  MCV 81.6  < > 83.3  --  84.2  PLT 196  < > 175  --  184  < > = values in this interval not displayed. No results found for this basename: INR, PROTIME    Recent Labs  10/02/12 1530  VITAMINB12 1027*  FOLATE 14.6  FERRITIN 198  TIBC 251  IRON 74  RETICCTPCT 0.9    Disposition: Home  Follow-up Appts: Follow-up with Dr. Wylene Simmer at [facility] in 1 week, has labs set for Thursday AM.   Condition on Discharge: Stable Tests Needing Follow-up: [facility]ET  Signed: Ronte Parker W 10/04/2012, 8:03 AM

## 2012-10-04 NOTE — Progress Notes (Signed)
Discharge Note. Pt and pt's daughter present for discharge instructions. Follow up appointments reviewed. Education on medications given. Pt and pt's daughter reported understanding. They asked a few questions about MyChart that were answered. Pt is ready for discharge but wants to make sure she has her lunch before she leaves.

## 2012-10-04 NOTE — Care Management Note (Signed)
  Page 2 of 2   10/04/2012     10:33:16 AM   CARE MANAGEMENT NOTE 10/04/2012  Patient:  [name], [name]   Account Number:  [id number]  Date Initiated:  10/04/2012  Documentation initiated by:  Ronny Flurry  Subjective/Objective Assessment:     Action/Plan:   Anticipated DC Date:  10/04/2012   Anticipated DC Plan:  HOME W HOME HEALTH SERVICES         Choice offered to / List presented to:  C-1 Patient   DME arranged  WALKER - ROLLING      DME agency  [facility]     HH arranged  HH-2 PT      HH agency  [facility]   Status of service:  Completed, signed off Medicare Important Message given?   (If response is "NO", the following Medicare IM given date fields will be blank) Date Medicare IM given:   Date Additional Medicare IM given:    Discharge Disposition:    Per UR Regulation:  Reviewed for med. necessity/level of care/duration of stay  If discussed at Long Length of Stay Meetings, dates discussed:    Comments:  10-04-12 Facesheet information confirmed with daughter Eber Jones .    Ordered rolling walker through [facility] ( due to insurance) Spoke to [place] at [employer] will be delivered to home this afternoon.  Faxed ( [phone] ) order , PT note and  facesheet to [facility] . Daughter Eber Jones wants Christoper Allegra to call her first before delivery . Latoya aware.  Eber Jones 's contact information on fax cover sheet.  Patient and Eber Jones aware of same.  Patient and daughter chose [facility] for HHPT . Daughter also wants to be called first .  Daughter Eber Jones : [phone] ( cell) [phone] ( home )   Ronny Flurry RN BSN [phone]

## 2013-02-16 ENCOUNTER — Other Ambulatory Visit: Payer: Self-pay

## 2013-02-16 DIAGNOSIS — Z1231 Encounter for screening mammogram for malignant neoplasm of breast: Secondary | ICD-10-CM

## 2013-03-20 ENCOUNTER — Ambulatory Visit
Admission: RE | Admit: 2013-03-20 | Discharge: 2013-03-20 | Disposition: A | Discharge status: Home / Self Care | Payer: Commercial Managed Care - HMO | Source: Ambulatory Visit

## 2013-03-20 DIAGNOSIS — Z1231 Encounter for screening mammogram for malignant neoplasm of breast: Secondary | ICD-10-CM

## 2014-02-22 ENCOUNTER — Other Ambulatory Visit: Payer: Self-pay

## 2014-02-22 DIAGNOSIS — Z1231 Encounter for screening mammogram for malignant neoplasm of breast: Secondary | ICD-10-CM

## 2014-03-22 ENCOUNTER — Ambulatory Visit: Payer: Commercial Managed Care - HMO

## 2014-03-26 ENCOUNTER — Ambulatory Visit
Admission: RE | Admit: 2014-03-26 | Discharge: 2014-03-26 | Disposition: A | Discharge status: Home / Self Care | Payer: Commercial Managed Care - HMO | Source: Ambulatory Visit

## 2014-03-26 DIAGNOSIS — Z1231 Encounter for screening mammogram for malignant neoplasm of breast: Secondary | ICD-10-CM

## 2014-08-30 ENCOUNTER — Inpatient Hospital Stay (HOSPITAL_COMMUNITY)
Admission: EM | Admit: 2014-08-30 | Discharge: 2014-09-04 | DRG: 690 | Disposition: A | Discharge status: Home Health | Payer: Commercial Managed Care - HMO | Attending: Internal Medicine | Admitting: Internal Medicine

## 2014-08-30 ENCOUNTER — Emergency Department (HOSPITAL_COMMUNITY): Payer: Commercial Managed Care - HMO

## 2014-08-30 ENCOUNTER — Encounter (HOSPITAL_COMMUNITY): Payer: Self-pay

## 2014-08-30 DIAGNOSIS — B961 Klebsiella pneumoniae [K. pneumoniae] as the cause of diseases classified elsewhere: Secondary | ICD-10-CM | POA: Diagnosis present

## 2014-08-30 DIAGNOSIS — C679 Malignant neoplasm of bladder, unspecified: Secondary | ICD-10-CM | POA: Diagnosis present

## 2014-08-30 DIAGNOSIS — B029 Zoster without complications: Secondary | ICD-10-CM | POA: Diagnosis present

## 2014-08-30 DIAGNOSIS — N390 Urinary tract infection, site not specified: Secondary | ICD-10-CM | POA: Diagnosis present

## 2014-08-30 DIAGNOSIS — I248 Other forms of acute ischemic heart disease: Secondary | ICD-10-CM | POA: Diagnosis present

## 2014-08-30 DIAGNOSIS — Z79899 Other long term (current) drug therapy: Secondary | ICD-10-CM | POA: Diagnosis not present

## 2014-08-30 DIAGNOSIS — N189 Chronic kidney disease, unspecified: Secondary | ICD-10-CM

## 2014-08-30 DIAGNOSIS — Z8551 Personal history of malignant neoplasm of bladder: Secondary | ICD-10-CM

## 2014-08-30 DIAGNOSIS — N179 Acute kidney failure, unspecified: Secondary | ICD-10-CM | POA: Diagnosis present

## 2014-08-30 DIAGNOSIS — D649 Anemia, unspecified: Secondary | ICD-10-CM | POA: Diagnosis present

## 2014-08-30 DIAGNOSIS — N184 Chronic kidney disease, stage 4 (severe): Secondary | ICD-10-CM | POA: Diagnosis present

## 2014-08-30 DIAGNOSIS — K8689 Other specified diseases of pancreas: Secondary | ICD-10-CM | POA: Diagnosis present

## 2014-08-30 DIAGNOSIS — E876 Hypokalemia: Secondary | ICD-10-CM | POA: Diagnosis not present

## 2014-08-30 DIAGNOSIS — E871 Hypo-osmolality and hyponatremia: Secondary | ICD-10-CM | POA: Diagnosis present

## 2014-08-30 DIAGNOSIS — I10 Essential (primary) hypertension: Secondary | ICD-10-CM | POA: Diagnosis present

## 2014-08-30 DIAGNOSIS — B9689 Other specified bacterial agents as the cause of diseases classified elsewhere: Secondary | ICD-10-CM | POA: Diagnosis present

## 2014-08-30 DIAGNOSIS — K868 Other specified diseases of pancreas: Secondary | ICD-10-CM | POA: Diagnosis present

## 2014-08-30 DIAGNOSIS — H409 Unspecified glaucoma: Secondary | ICD-10-CM | POA: Diagnosis present

## 2014-08-30 DIAGNOSIS — Z936 Other artificial openings of urinary tract status: Secondary | ICD-10-CM | POA: Diagnosis not present

## 2014-08-30 DIAGNOSIS — R531 Weakness: Secondary | ICD-10-CM

## 2014-08-30 DIAGNOSIS — I129 Hypertensive chronic kidney disease with stage 1 through stage 4 chronic kidney disease, or unspecified chronic kidney disease: Secondary | ICD-10-CM | POA: Diagnosis present

## 2014-08-30 HISTORY — DX: Zoster without complications: B02.9

## 2014-08-30 LAB — URINALYSIS, ROUTINE W REFLEX MICROSCOPIC
Bilirubin Urine: NEGATIVE
Glucose, UA: NEGATIVE mg/dL
Ketones, ur: NEGATIVE mg/dL
NITRITE: NEGATIVE
Protein, ur: 100 mg/dL — AB
Specific Gravity, Urine: 1.007 (ref 1.005–1.030)
Urobilinogen, UA: 0.2 mg/dL (ref 0.0–1.0)
pH: 8.5 — ABNORMAL HIGH (ref 5.0–8.0)

## 2014-08-30 LAB — CBC
HEMATOCRIT: 24.2 % — AB (ref 36.0–46.0)
Hemoglobin: 8.4 g/dL — ABNORMAL LOW (ref 12.0–15.0)
MCH: 28.6 pg (ref 26.0–34.0)
MCHC: 34.7 g/dL (ref 30.0–36.0)
MCV: 82.3 fL (ref 78.0–100.0)
Platelets: 193 10*3/uL (ref 150–400)
RBC: 2.94 MIL/uL — ABNORMAL LOW (ref 3.87–5.11)
RDW: 14 % (ref 11.5–15.5)
WBC: 4.5 10*3/uL (ref 4.0–10.5)

## 2014-08-30 LAB — BRAIN NATRIURETIC PEPTIDE: B Natriuretic Peptide: 395.6 pg/mL — ABNORMAL HIGH (ref 0.0–100.0)

## 2014-08-30 LAB — URINE MICROSCOPIC-ADD ON

## 2014-08-30 LAB — BASIC METABOLIC PANEL
Anion gap: 7 (ref 5–15)
BUN: 38 mg/dL — AB (ref 6–20)
CHLORIDE: 98 mmol/L — AB (ref 101–111)
CO2: 22 mmol/L (ref 22–32)
CREATININE: 2.4 mg/dL — AB (ref 0.44–1.00)
Calcium: 8.4 mg/dL — ABNORMAL LOW (ref 8.9–10.3)
GFR calc Af Amer: 19 mL/min — ABNORMAL LOW (ref >60)
GFR calc non Af Amer: 16 mL/min — ABNORMAL LOW (ref >60)
GLUCOSE: 100 mg/dL — AB (ref 65–99)
Potassium: 3.6 mmol/L (ref 3.5–5.1)
Sodium: 127 mmol/L — ABNORMAL LOW (ref 135–145)

## 2014-08-30 LAB — HEPATIC FUNCTION PANEL
ALK PHOS: 33 U/L — AB (ref 38–126)
ALT: 13 U/L — AB (ref 14–54)
AST: 52 U/L — AB (ref 15–41)
Albumin: 3.2 g/dL — ABNORMAL LOW (ref 3.5–5.0)
Bilirubin, Direct: <0.1 mg/dL — ABNORMAL LOW (ref 0.1–0.5)
Total Bilirubin: 0.4 mg/dL (ref 0.3–1.2)
Total Protein: 6 g/dL — ABNORMAL LOW (ref 6.5–8.1)

## 2014-08-30 LAB — TROPONIN I: Troponin I: 0.17 ng/mL — ABNORMAL HIGH (ref <0.031)

## 2014-08-30 LAB — CBG MONITORING, ED: GLUCOSE-CAPILLARY: 94 mg/dL (ref 65–99)

## 2014-08-30 MED ORDER — DEXTROSE 5 % IV SOLN
1.0000 g | Freq: Once | INTRAVENOUS | Status: AC
  Administered 2014-08-30: 1 g via INTRAVENOUS
  Filled 2014-08-30: qty 10

## 2014-08-30 MED ORDER — SODIUM CHLORIDE 0.9 % IV BOLUS (SEPSIS)
1000.0000 mL | Freq: Once | INTRAVENOUS | Status: AC
  Administered 2014-08-30: 1000 mL via INTRAVENOUS

## 2014-08-30 NOTE — ED Notes (Signed)
Patient's daughter reports shingles x 2 week on left abdomen and left flank area. Patient has taken 2 week dose of antiviral meds. Patient now c/o weakness x 1 week.

## 2014-08-30 NOTE — ED Provider Notes (Signed)
CSN: [id number]     Arrival date & time 08/30/14  1900 History   First MD Initiated Contact with Patient 08/30/14 1922     Chief Complaint  Patient presents with  . Weakness     (Consider location/radiation/quality/duration/timing/severity/associated sxs/prior Treatment) Patient is a 81 y.o. female presenting with weakness. The history is provided by the patient.  Weakness This is a new problem. The current episode started more than 2 days ago. The problem occurs constantly. The problem has been gradually worsening. Pertinent negatives include no chest pain, no abdominal pain and no shortness of breath. Nothing aggravates the symptoms. Nothing relieves the symptoms.    Past Medical History  Diagnosis Date  . Hypertension   . Shingles    Past Surgical History  Procedure Laterality Date  . Colon surgery     Family History  Problem Relation Age of Onset  . Family history unknown: Yes   Social History  Substance Use Topics  . Smoking status: Never Smoker   . Smokeless tobacco: Never Used  . Alcohol Use: No   OB History    No data available     Review of Systems  Constitutional: Negative for fever.  Respiratory: Negative for cough and shortness of breath.   Cardiovascular: Negative for chest pain.  Gastrointestinal: Negative for vomiting and abdominal pain.  Neurological: Positive for weakness.  All other systems reviewed and are negative.     Allergies  Review of patient's allergies indicates no known allergies.  Home Medications   Prior to Admission medications   Medication Sig Start Date End Date Taking? Authorizing Provider  alendronate (FOSAMAX) 70 MG tablet Take 70 mg by mouth every 7 (seven) days. On Mondays 08/30/12  Yes Historical Provider, MD  amLODipine (NORVASC) 10 MG tablet Take 1 tablet (10 mg total) by mouth daily. 10/04/12  Yes Rich Tisovec, MD  cholecalciferol (VITAMIN D) 1000 UNITS tablet Take 5,000 Units by mouth daily.   Yes Historical Provider,  MD  cloNIDine (CATAPRES) 0.1 MG tablet Take 1 tablet (0.1 mg total) by mouth 2 (two) times daily. 10/04/12  Yes Rich Tisovec, MD  docusate sodium 100 MG CAPS Take 100 mg by mouth 2 (two) times daily. 10/04/12  Yes Rich Tisovec, MD  ferrous fumarate (HEMOCYTE - 106 MG FE) 325 (106 FE) MG TABS tablet Take 1 tablet by mouth 2 (two) times daily.   Yes Historical Provider, MD  furosemide (LASIX) 20 MG tablet Take 20 mg by mouth daily.   Yes Historical Provider, MD  lipase/protease/amylase (CREON) 36000 UNITS CPEP capsule Take 36,000 Units by mouth 3 (three) times daily before meals.   Yes Historical Provider, MD  Olopatadine HCl (PATADAY) 0.2 % SOLN Apply 1 drop to eye at bedtime.   Yes Historical Provider, MD  Polyvinyl Alcohol-Povidone (REFRESH OP) Apply 1 drop to eye daily as needed (dry eyes).   Yes Historical Provider, MD  lipase/protease/amylase (CREON-12/PANCREASE) 12000 UNITS CPEP capsule Take 2 capsules by mouth 3 (three) times daily before meals. Patient not taking: Reported on 08/30/2014 10/04/12   Drenda Freeze, MD   BP 209/63 mmHg  Pulse 74  Temp(Src) 99.1 F (37.3 C) (Oral)  Resp 16  Ht 5\' 2"  (1.575 m)  Wt 93 lb (42.185 kg)  BMI 17.01 kg/m2  SpO2 99% Physical Exam  Constitutional: She is oriented to person, place, and time. She appears well-developed and well-nourished. No distress.  HENT:  Head: Normocephalic and atraumatic.  Mouth/Throat: Oropharynx is clear and moist.  Eyes: EOM  are normal. Pupils are equal, round, and reactive to light.  Neck: Normal range of motion. Neck supple.  Cardiovascular: Normal rate and regular rhythm.  Exam reveals no friction rub.   No murmur heard. Pulmonary/Chest: Effort normal and breath sounds normal. No respiratory distress. She has no wheezes. She has no rales.  Abdominal: Soft. She exhibits no distension. There is no tenderness. There is no rebound.  Musculoskeletal: Normal range of motion. She exhibits edema (3+ up to tibia diffuse, 2+ up to  hips posteriorly).  Neurological: She is alert and oriented to person, place, and time.  Skin: No rash noted. She is not diaphoretic.  Nursing note and vitals reviewed.   ED Course  Procedures (including critical care time) Labs Review Labs Reviewed  BASIC METABOLIC PANEL - Abnormal; Notable for the following:    Sodium 127 (*)    Chloride 98 (*)    Glucose, Bld 100 (*)    BUN 38 (*)    Creatinine, Ser 2.40 (*)    Calcium 8.4 (*)    GFR calc non Af Amer 16 (*)    GFR calc Af Amer 19 (*)    All other components within normal limits  CBC - Abnormal; Notable for the following:    RBC 2.94 (*)    Hemoglobin 8.4 (*)    HCT 24.2 (*)    All other components within normal limits  URINALYSIS, ROUTINE W REFLEX MICROSCOPIC (NOT AT [facility]) - Abnormal; Notable for the following:    APPearance CLOUDY (*)    pH 8.5 (*)    Hgb urine dipstick SMALL (*)    Protein, ur 100 (*)    Leukocytes, UA LARGE (*)    All other components within normal limits  HEPATIC FUNCTION PANEL - Abnormal; Notable for the following:    Total Protein 6.0 (*)    Albumin 3.2 (*)    AST 52 (*)    ALT 13 (*)    Alkaline Phosphatase 33 (*)    Bilirubin, Direct <0.1 (*)    All other components within normal limits  BRAIN NATRIURETIC PEPTIDE - Abnormal; Notable for the following:    B Natriuretic Peptide 395.6 (*)    All other components within normal limits  TROPONIN I - Abnormal; Notable for the following:    Troponin I 0.17 (*)    All other components within normal limits  URINE MICROSCOPIC-ADD ON - Abnormal; Notable for the following:    Bacteria, UA MANY (*)    All other components within normal limits  CBG MONITORING, ED    Imaging Review Dg Chest 2 View  08/30/2014   CLINICAL DATA:  Pain following fall  EXAM: CHEST  2 VIEW  COMPARISON:  October 01, 2012  FINDINGS: There is atelectasis in the left base. The lungs elsewhere clear. Heart size and pulmonary vascularity are normal. No adenopathy. There is  lower thoracic levoscoliosis. No pneumothorax.  IMPRESSION: Left base atelectasis. No edema or consolidation. Marked thoracolumbar levoscoliosis.   Electronically Signed   By: Lowella Grip III M.D.   On: 08/30/2014 20:59   Dg Pelvis 1-2 Views  08/30/2014   CLINICAL DATA:  Daughter states pt slipped out of wheelchair yesterday. Pt complained of some bilateral knee swelling. Daughter states pt has been really week. NO chest complaints. Hx of HTN.  EXAM: PELVIS - 1-2 VIEW  COMPARISON:  CT, 10/01/2012  FINDINGS: No fracture. Hip joints, SI joints and symphysis pubis are normally aligned. Bones are diffusely demineralized.  There are numerous surgical vascular calcifications and bowel anastomosis staples in the low abdomen and pelvis. Dense femoral and iliac artery vascular calcifications are noted.  IMPRESSION: 1. No fracture or dislocation.  No acute finding.   Electronically Signed   By: Lajean Manes M.D.   On: 08/30/2014 20:58   Dg Knee 2 Views Left  08/30/2014   CLINICAL DATA:  Daughter states pt slipped out of wheelchair yesterday. Pt complained of some bilateral knee swelling. Daughter states pt has been really week. NO chest complaints. Hx of HTN.  EXAM: LEFT KNEE - 1-2 VIEW  COMPARISON:  None.  FINDINGS: No fracture. Knee joint is normally aligned. There is medial joint space compartment narrowing. Bones are demineralized. No bone lesion.  No joint effusion. There are dense vascular calcifications posteriorly.  IMPRESSION: No fracture or dislocation.   Electronically Signed   By: Lajean Manes M.D.   On: 08/30/2014 20:56   Dg Knee 2 Views Right  08/30/2014   CLINICAL DATA:  Daughter states pt slipped out of wheelchair yesterday. Pt complained of some bilateral knee swelling. Daughter states pt has been really week. NO chest complaints. Hx of HTN.  EXAM: RIGHT KNEE - 1-2 VIEW  COMPARISON:  None.  FINDINGS: No fracture. No bone lesion. Knee joint is normally spaced and aligned. No joint effusion.  Bones are demineralized. There are dense vascular calcifications posteriorly.  IMPRESSION: No fracture or dislocation.   Electronically Signed   By: Lajean Manes M.D.   On: 08/30/2014 20:57   I, Osvaldo Shipper, personally reviewed and evaluated these images and lab results as part of my medical decision-making.   EKG Interpretation   Date/Time:  Thursday August 30 2014 19:23:43 EDT Ventricular Rate:  62 PR Interval:  321 QRS Duration: 79 QT Interval:  402 QTC Calculation: 408 R Axis:   45 Text Interpretation:  Sinus rhythm Prolonged PR interval Probable left  atrial enlargement Abnormal R-wave progression, early transition Abnrm T,  consider ischemia, anterolateral lds No significant  change since prior  Confirmed by Mingo Amber  MD, Allesandra Huebsch ([id number]) on 08/30/2014 10:34:33 PM      MDM   Final diagnoses:  Acute on chronic renal failure  Weakness    2F here with decreased PO intake, weakness. Fell earlier, hit her knees. Denies any hip or knee pain.  No CP, SOB, dizziness. No N/V/D.  AFVSS here. Diffuse LE edema, extends on the posterior aspect up to the hips. Patient reports this is normal for her. Labs show acute on chronic renal failure. Troponin also mildly elevated. Denies CP at this time. Likely secondary to dehydration. Admitted.    Evelina Bucy, MD 08/31/14 [phone]

## 2014-08-30 NOTE — H&P (Signed)
[facility] History and Physical  [name] ZJI:[id number] DOB: 1919/03/01 DOA: 08/30/2014  Referring physician: Evelina Bucy, M.D. PCP: [name], MD   Chief Complaint: Weakness.  HPI: [name] is a 81 y.o. female with past medical history of hypertension, pancreatic insufficiency, glaucoma, urinary bladder cancer, status post ileal conduit who has been treated recently for herpes zoster for the past 2 weeks and is being brought by her daughter due to progressive weakness, decreased appetite, decreased mentation and somnolence for about a week. Her daughter denies notice and fever, but states that the patient has had chills.  Apparently there hasn't been any chest pain, productive cough, nausea, emesis or diarrhea. She is currently in no acute distress.   Review of Systems:  Unable to evaluate due to patient's confusion. History is taken from the patient's daughter.  Past Medical History  Diagnosis Date  . Hypertension   . Shingles    Past Surgical History  Procedure Laterality Date  . Colon surgery     Social History:  reports that she has never smoked. She has never used smokeless tobacco. She reports that she does not drink alcohol or use illicit drugs.  No Known Allergies  Family History  Problem Relation Age of Onset  . Family history unknown: Yes    Prior to Admission medications   Medication Sig Start Date End Date Taking? Authorizing Provider  alendronate (FOSAMAX) 70 MG tablet Take 70 mg by mouth every 7 (seven) days. On Mondays 08/30/12  Yes Historical Provider, MD  amLODipine (NORVASC) 10 MG tablet Take 1 tablet (10 mg total) by mouth daily. 10/04/12  Yes Rich Tisovec, MD  cholecalciferol (VITAMIN D) 1000 UNITS tablet Take 5,000 Units by mouth daily.   Yes Historical Provider, MD  cloNIDine (CATAPRES) 0.1 MG tablet Take 1 tablet (0.1 mg total) by mouth 2 (two) times daily. 10/04/12  Yes Rich Tisovec, MD  docusate sodium 100 MG CAPS Take 100  mg by mouth 2 (two) times daily. 10/04/12  Yes Rich Tisovec, MD  ferrous fumarate (HEMOCYTE - 106 MG FE) 325 (106 FE) MG TABS tablet Take 1 tablet by mouth 2 (two) times daily.   Yes Historical Provider, MD  furosemide (LASIX) 20 MG tablet Take 20 mg by mouth daily.   Yes Historical Provider, MD  lipase/protease/amylase (CREON) 36000 UNITS CPEP capsule Take 36,000 Units by mouth 3 (three) times daily before meals.   Yes Historical Provider, MD  Olopatadine HCl (PATADAY) 0.2 % SOLN Apply 1 drop to eye at bedtime.   Yes Historical Provider, MD  Polyvinyl Alcohol-Povidone (REFRESH OP) Apply 1 drop to eye daily as needed (dry eyes).   Yes Historical Provider, MD  lipase/protease/amylase (CREON-12/PANCREASE) 12000 UNITS CPEP capsule Take 2 capsules by mouth 3 (three) times daily before meals. Patient not taking: Reported on 08/30/2014 10/04/12   Drenda Freeze, MD   Physical Exam: Filed Vitals:   08/30/14 1905 08/30/14 2216 08/30/14 2230  BP: 155/63 209/63 201/69  Pulse: 65 74 74  Temp: 99.1 F (37.3 C)    TempSrc: Oral    Resp: 16 16 18   Height: 5\' 2"  (1.575 m)    Weight: 42.185 kg (93 lb)    SpO2: 99% 99% 100%    Wt Readings from Last 3 Encounters:  08/30/14 42.185 kg (93 lb)  10/04/12 42.2 kg (93 lb 0.6 oz)    General:  Appears calm and comfortable Eyes: PERRL, normal lids, irises & conjunctiva ENT: grossly normal hearing, lips &  tongue Neck: no LAD, masses or thyromegaly Cardiovascular: RRR, positive murmur. No LE edema. Telemetry: SR, no arrhythmias  Respiratory: CTA bilaterally, no w/r/r. Normal respiratory effort. Abdomen: soft, ntnd, ileal conduit present. Skin: left back and flank rash. Musculoskeletal: grossly normal tone BUE/BLE Psychiatric: grossly normal mood and affect, speech fluent but mildly confused Neurologic: Global weakness, unable to fully evaluate.           Labs on Admission:  Basic Metabolic Panel:  Recent Labs Lab 08/30/14 1930  NA 127*  K 3.6  CL  98*  CO2 22  GLUCOSE 100*  BUN 38*  CREATININE 2.40*  CALCIUM 8.4*   Liver Function Tests:  Recent Labs Lab 08/30/14 1928  AST 52*  ALT 13*  ALKPHOS 33*  BILITOT 0.4  PROT 6.0*  ALBUMIN 3.2*   No results for input(s): LIPASE, AMYLASE in the last 168 hours. No results for input(s): AMMONIA in the last 168 hours. CBC:  Recent Labs Lab 08/30/14 1930  WBC 4.5  HGB 8.4*  HCT 24.2*  MCV 82.3  PLT 193   Cardiac Enzymes:  Recent Labs Lab 08/30/14 2039  TROPONINI 0.17*    BNP (last 3 results)  Recent Labs  08/30/14 1928  BNP 395.6*    CBG:  Recent Labs Lab 08/30/14 1942  GLUCAP 94    Radiological Exams on Admission: Dg Chest 2 View  08/30/2014   CLINICAL DATA:  Pain following fall  EXAM: CHEST  2 VIEW  COMPARISON:  October 01, 2012  FINDINGS: There is atelectasis in the left base. The lungs elsewhere clear. Heart size and pulmonary vascularity are normal. No adenopathy. There is lower thoracic levoscoliosis. No pneumothorax.  IMPRESSION: Left base atelectasis. No edema or consolidation. Marked thoracolumbar levoscoliosis.   Electronically Signed   By: Lowella Grip III M.D.   On: 08/30/2014 20:59   Dg Pelvis 1-2 Views  08/30/2014   CLINICAL DATA:  Daughter states pt slipped out of wheelchair yesterday. Pt complained of some bilateral knee swelling. Daughter states pt has been really week. NO chest complaints. Hx of HTN.  EXAM: PELVIS - 1-2 VIEW  COMPARISON:  CT, 10/01/2012  FINDINGS: No fracture. Hip joints, SI joints and symphysis pubis are normally aligned. Bones are diffusely demineralized.  There are numerous surgical vascular calcifications and bowel anastomosis staples in the low abdomen and pelvis. Dense femoral and iliac artery vascular calcifications are noted.  IMPRESSION: 1. No fracture or dislocation.  No acute finding.   Electronically Signed   By: Lajean Manes M.D.   On: 08/30/2014 20:58   Dg Knee 2 Views Left  08/30/2014   CLINICAL DATA:   Daughter states pt slipped out of wheelchair yesterday. Pt complained of some bilateral knee swelling. Daughter states pt has been really week. NO chest complaints. Hx of HTN.  EXAM: LEFT KNEE - 1-2 VIEW  COMPARISON:  None.  FINDINGS: No fracture. Knee joint is normally aligned. There is medial joint space compartment narrowing. Bones are demineralized. No bone lesion.  No joint effusion. There are dense vascular calcifications posteriorly.  IMPRESSION: No fracture or dislocation.   Electronically Signed   By: Lajean Manes M.D.   On: 08/30/2014 20:56   Dg Knee 2 Views Right  08/30/2014   CLINICAL DATA:  Daughter states pt slipped out of wheelchair yesterday. Pt complained of some bilateral knee swelling. Daughter states pt has been really week. NO chest complaints. Hx of HTN.  EXAM: RIGHT KNEE - 1-2 VIEW  COMPARISON:  None.  FINDINGS: No fracture. No bone lesion. Knee joint is normally spaced and aligned. No joint effusion. Bones are demineralized. There are dense vascular calcifications posteriorly.  IMPRESSION: No fracture or dislocation.   Electronically Signed   By: Lajean Manes M.D.   On: 08/30/2014 20:57    EKG: Independently reviewed. Vent. rate 62 BPM PR interval 321 ms QRS duration 79 ms QT/QTc 402/408 ms P-R-T axes -6 45 132 Sinus rhythm Prolonged PR interval Probable left atrial enlargement Abnormal R-wave progression, early transition Abnrm T, consider ischemia, anterolateral lds No significant change since prior  Assessment/Plan Principal Problem:   UTI (urinary tract infection) with pyuria  S/P ileal conduit/H/O Urinary bladder cancer Patient has an increased risk for polymicrobial UTI, but does not seem to be very symptomatic. I'll continue ceftriaxone IV while we wait for urine culture and sensitivity results.  Active Problems:   Hyponatremia Hold furosemide, very gentle hydration with normal saline. Follow-up sodium level.    Hypertension Continue current  antihypertensive therapy and monitor blood pressure.     Pancreatic insufficiency Continue pancreatic enzyme supplementations with meals.    Glaucoma Continue current treatment.    Generalized weakness Treat UTI and have a PT evaluate and treat.   Code Status: Full code. DVT Prophylaxis: Lovenox SQ Family Communication:   Hill,Carolyn Daughter [phone]  [phone]  Disposition Plan: Admit for IV antibiotics for 2-3 days and discharged home or to a skilled nursing facility for physical therapy.  Time spent: Over 70 minutes.  Reubin Milan [facility] Pager [phone].

## 2014-08-31 DIAGNOSIS — N390 Urinary tract infection, site not specified: Principal | ICD-10-CM

## 2014-08-31 DIAGNOSIS — R531 Weakness: Secondary | ICD-10-CM

## 2014-08-31 DIAGNOSIS — N179 Acute kidney failure, unspecified: Secondary | ICD-10-CM | POA: Diagnosis present

## 2014-08-31 DIAGNOSIS — N189 Chronic kidney disease, unspecified: Secondary | ICD-10-CM

## 2014-08-31 DIAGNOSIS — E871 Hypo-osmolality and hyponatremia: Secondary | ICD-10-CM

## 2014-08-31 DIAGNOSIS — I10 Essential (primary) hypertension: Secondary | ICD-10-CM

## 2014-08-31 DIAGNOSIS — H409 Unspecified glaucoma: Secondary | ICD-10-CM

## 2014-08-31 LAB — CBC WITH DIFFERENTIAL/PLATELET
BASOS PCT: 0 % (ref 0–1)
Basophils Absolute: 0 10*3/uL (ref 0.0–0.1)
EOS PCT: 3 % (ref 0–5)
Eosinophils Absolute: 0.2 10*3/uL (ref 0.0–0.7)
HEMATOCRIT: 28.5 % — AB (ref 36.0–46.0)
Hemoglobin: 9.9 g/dL — ABNORMAL LOW (ref 12.0–15.0)
Lymphocytes Relative: 29 % (ref 12–46)
Lymphs Abs: 1.4 10*3/uL (ref 0.7–4.0)
MCH: 28.7 pg (ref 26.0–34.0)
MCHC: 34.7 g/dL (ref 30.0–36.0)
MCV: 82.6 fL (ref 78.0–100.0)
MONO ABS: 0.5 10*3/uL (ref 0.1–1.0)
Monocytes Relative: 11 % (ref 3–12)
Neutro Abs: 2.7 10*3/uL (ref 1.7–7.7)
Neutrophils Relative %: 57 % (ref 43–77)
Platelets: 203 10*3/uL (ref 150–400)
RBC: 3.45 MIL/uL — ABNORMAL LOW (ref 3.87–5.11)
RDW: 14.1 % (ref 11.5–15.5)
WBC: 4.9 10*3/uL (ref 4.0–10.5)

## 2014-08-31 LAB — COMPREHENSIVE METABOLIC PANEL
ALK PHOS: 36 U/L — AB (ref 38–126)
ALT: 16 U/L (ref 14–54)
AST: 53 U/L — ABNORMAL HIGH (ref 15–41)
Albumin: 2.9 g/dL — ABNORMAL LOW (ref 3.5–5.0)
Anion gap: 8 (ref 5–15)
BUN: 35 mg/dL — ABNORMAL HIGH (ref 6–20)
CALCIUM: 8.4 mg/dL — AB (ref 8.9–10.3)
CO2: 22 mmol/L (ref 22–32)
Chloride: 100 mmol/L — ABNORMAL LOW (ref 101–111)
Creatinine, Ser: 2.31 mg/dL — ABNORMAL HIGH (ref 0.44–1.00)
GFR, EST AFRICAN AMERICAN: 20 mL/min — AB (ref >60)
GFR, EST NON AFRICAN AMERICAN: 17 mL/min — AB (ref >60)
Glucose, Bld: 116 mg/dL — ABNORMAL HIGH (ref 65–99)
POTASSIUM: 3.2 mmol/L — AB (ref 3.5–5.1)
SODIUM: 130 mmol/L — AB (ref 135–145)
Total Bilirubin: 0.4 mg/dL (ref 0.3–1.2)
Total Protein: 6.1 g/dL — ABNORMAL LOW (ref 6.5–8.1)

## 2014-08-31 LAB — TROPONIN I
Troponin I: 0.14 ng/mL — ABNORMAL HIGH (ref <0.031)
Troponin I: 0.15 ng/mL — ABNORMAL HIGH (ref <0.031)
Troponin I: 0.14 ng/mL — ABNORMAL HIGH (ref <0.031)

## 2014-08-31 MED ORDER — POLYVINYL ALCOHOL 1.4 % OP SOLN
1.0000 [drp] | Freq: Every day | OPHTHALMIC | Status: DC | PRN
  Administered 2014-09-03: 1 [drp] via OPHTHALMIC
  Filled 2014-08-31: qty 15

## 2014-08-31 MED ORDER — PANCRELIPASE (LIP-PROT-AMYL) 36000-114000 UNITS PO CPEP
36000.0000 [IU] | ORAL_CAPSULE | Freq: Three times a day (TID) | ORAL | Status: DC
  Administered 2014-08-31 – 2014-09-04 (×13): 36000 [IU] via ORAL
  Filled 2014-08-31 (×16): qty 1

## 2014-08-31 MED ORDER — VITAMIN D 1000 UNITS PO TABS
5000.0000 [IU] | ORAL_TABLET | Freq: Every day | ORAL | Status: DC
  Administered 2014-08-31 – 2014-09-04 (×5): 5000 [IU] via ORAL
  Filled 2014-08-31 (×5): qty 5

## 2014-08-31 MED ORDER — ALENDRONATE SODIUM 70 MG PO TABS: 70.0000 mg | ORAL_TABLET | ORAL | Status: DC

## 2014-08-31 MED ORDER — FERROUS FUMARATE 325 (106 FE) MG PO TABS
1.0000 | ORAL_TABLET | Freq: Two times a day (BID) | ORAL | Status: DC
  Administered 2014-08-31 – 2014-09-01 (×4): 1 via ORAL
  Administered 2014-09-01 – 2014-09-04 (×6): 106 mg via ORAL
  Filled 2014-08-31 (×12): qty 1

## 2014-08-31 MED ORDER — SODIUM CHLORIDE 0.9 % IJ SOLN
3.0000 mL | Freq: Two times a day (BID) | INTRAMUSCULAR | Status: DC
  Administered 2014-08-31 – 2014-09-04 (×5): 3 mL via INTRAVENOUS

## 2014-08-31 MED ORDER — DEXTROSE 5 % IV SOLN
1.0000 g | Freq: Every day | INTRAVENOUS | Status: DC
  Administered 2014-08-31 – 2014-09-02 (×3): 1 g via INTRAVENOUS
  Filled 2014-08-31 (×3): qty 10

## 2014-08-31 MED ORDER — HYDRALAZINE HCL 20 MG/ML IJ SOLN
5.0000 mg | Freq: Once | INTRAMUSCULAR | Status: AC
  Administered 2014-08-31: 5 mg via INTRAVENOUS
  Filled 2014-08-31: qty 1

## 2014-08-31 MED ORDER — OLOPATADINE HCL 0.1 % OP SOLN
1.0000 [drp] | Freq: Every day | OPHTHALMIC | Status: DC
  Administered 2014-08-31 – 2014-09-03 (×5): 1 [drp] via OPHTHALMIC
  Filled 2014-08-31: qty 5

## 2014-08-31 MED ORDER — ENOXAPARIN SODIUM 30 MG/0.3ML ~~LOC~~ SOLN
30.0000 mg | SUBCUTANEOUS | Status: DC
  Administered 2014-08-31 – 2014-09-04 (×5): 30 mg via SUBCUTANEOUS
  Filled 2014-08-31 (×5): qty 0.3

## 2014-08-31 MED ORDER — AMLODIPINE BESYLATE 10 MG PO TABS
10.0000 mg | ORAL_TABLET | Freq: Every day | ORAL | Status: DC
  Administered 2014-08-31 – 2014-09-04 (×5): 10 mg via ORAL
  Filled 2014-08-31 (×5): qty 1

## 2014-08-31 MED ORDER — CLONIDINE HCL 0.1 MG PO TABS
0.1000 mg | ORAL_TABLET | Freq: Two times a day (BID) | ORAL | Status: DC
Start: 2014-08-31 — End: 2014-09-03
  Administered 2014-08-31 – 2014-09-02 (×7): 0.1 mg via ORAL
  Filled 2014-08-31 (×7): qty 1

## 2014-08-31 MED ORDER — SODIUM CHLORIDE 0.45 % IV SOLN
INTRAVENOUS | Status: AC
  Administered 2014-08-31 – 2014-09-01 (×2): via INTRAVENOUS

## 2014-08-31 MED ORDER — POTASSIUM CHLORIDE CRYS ER 20 MEQ PO TBCR
40.0000 meq | EXTENDED_RELEASE_TABLET | Freq: Once | ORAL | Status: AC
  Administered 2014-08-31: 40 meq via ORAL
  Filled 2014-08-31: qty 2

## 2014-08-31 NOTE — Progress Notes (Signed)
[facility] PROGRESS NOTE  [name] GDJ:[id number] DOB: 11-20-1919 DOA: 08/30/2014  PCP: [name], MD  Brief HPI: 81 year old African-American female with a past medical history of hypertension, pancreatic insufficiency, with a known ileal conduit who was recently treated for herpes zoster and presented with complaints of progressive weakness, decreased appetite and confusion. She was noted to have a urinary tract infection and was hospitalized.  Past medical history:  Past Medical History  Diagnosis Date  . Hypertension   . Shingles     Consultants: None  Procedures: None  Antibiotics: Ceftriaxone  Subjective: Patient feels about the same. Not much improvement. She denies any chest pain, shortness of breath, nausea, vomiting. Her daughter is at bedside.  Objective: Vital Signs  Filed Vitals:   08/30/14 2338 08/31/14 0030 08/31/14 0135 08/31/14 0419  BP: 194/62 190/58 200/63 164/52  Pulse: 78 78 77   Temp: 98.4 F (36.9 C)  97.8 F (36.6 C)   TempSrc: Oral  Oral   Resp: 15 16 16    Height:   5\' 2"  (1.575 m)   Weight:   52 kg (114 lb 10.2 oz)   SpO2: 99% 98% 100%     Intake/Output Summary (Last 24 hours) at 08/31/14 0911 Last data filed at 08/31/14 0216  Gross per 24 hour  Intake      3 ml  Output    600 ml  Net   -597 ml   Filed Weights   08/30/14 1905 08/31/14 0135  Weight: 42.185 kg (93 lb) 52 kg (114 lb 10.2 oz)    General appearance: alert, cooperative, appears stated age and no distress Resp: Diminished air entry at the bases. No crackles or wheezing. Cardio: S1, S2 is normal, regular. Systolic murmur appreciated over the precordium. No S3, S4. No rubs, or bruit. No pedal edema. GI: soft, non-tender; bowel sounds normal; no masses,  no organomegaly and Ileal conduit noted on the left draining yellow urine Extremities: extremities normal, atraumatic, no cyanosis or edema Skin exam: No vesicles noted at the site of her herpes zoster  in the left lower abdomen and back. Lesions appear to be healing. Neurologic: Generalized weakness appreciated without any focal deficits.  Lab Results:  Basic Metabolic Panel:  Recent Labs Lab 08/30/14 1930 08/31/14 0205  NA 127* 130*  K 3.6 3.2*  CL 98* 100*  CO2 22 22  GLUCOSE 100* 116*  BUN 38* 35*  CREATININE 2.40* 2.31*  CALCIUM 8.4* 8.4*   Liver Function Tests:  Recent Labs Lab 08/30/14 1928 08/31/14 0205  AST 52* 53*  ALT 13* 16  ALKPHOS 33* 36*  BILITOT 0.4 0.4  PROT 6.0* 6.1*  ALBUMIN 3.2* 2.9*   CBC:  Recent Labs Lab 08/30/14 1930 08/31/14 0205  WBC 4.5 4.9  NEUTROABS  --  2.7  HGB 8.4* 9.9*  HCT 24.2* 28.5*  MCV 82.3 82.6  PLT 193 203   Cardiac Enzymes:  Recent Labs Lab 08/30/14 2039 08/31/14 0205 08/31/14 0815  TROPONINI 0.17* 0.14* 0.15*   BNP (last 3 results)  Recent Labs  08/30/14 1928  BNP 395.6*    CBG:  Recent Labs Lab 08/30/14 1942  GLUCAP 94    No results found for this or any previous visit (from the past 240 hour(s)).    Studies/Results: Dg Chest 2 View  08/30/2014   CLINICAL DATA:  Pain following fall  EXAM: CHEST  2 VIEW  COMPARISON:  October 01, 2012  FINDINGS: There is atelectasis in the left  base. The lungs elsewhere clear. Heart size and pulmonary vascularity are normal. No adenopathy. There is lower thoracic levoscoliosis. No pneumothorax.  IMPRESSION: Left base atelectasis. No edema or consolidation. Marked thoracolumbar levoscoliosis.   Electronically Signed   By: Lowella Grip III M.D.   On: 08/30/2014 20:59   Dg Pelvis 1-2 Views  08/30/2014   CLINICAL DATA:  Daughter states pt slipped out of wheelchair yesterday. Pt complained of some bilateral knee swelling. Daughter states pt has been really week. NO chest complaints. Hx of HTN.  EXAM: PELVIS - 1-2 VIEW  COMPARISON:  CT, 10/01/2012  FINDINGS: No fracture. Hip joints, SI joints and symphysis pubis are normally aligned. Bones are diffusely  demineralized.  There are numerous surgical vascular calcifications and bowel anastomosis staples in the low abdomen and pelvis. Dense femoral and iliac artery vascular calcifications are noted.  IMPRESSION: 1. No fracture or dislocation.  No acute finding.   Electronically Signed   By: Lajean Manes M.D.   On: 08/30/2014 20:58   Dg Knee 2 Views Left  08/30/2014   CLINICAL DATA:  Daughter states pt slipped out of wheelchair yesterday. Pt complained of some bilateral knee swelling. Daughter states pt has been really week. NO chest complaints. Hx of HTN.  EXAM: LEFT KNEE - 1-2 VIEW  COMPARISON:  None.  FINDINGS: No fracture. Knee joint is normally aligned. There is medial joint space compartment narrowing. Bones are demineralized. No bone lesion.  No joint effusion. There are dense vascular calcifications posteriorly.  IMPRESSION: No fracture or dislocation.   Electronically Signed   By: Lajean Manes M.D.   On: 08/30/2014 20:56   Dg Knee 2 Views Right  08/30/2014   CLINICAL DATA:  Daughter states pt slipped out of wheelchair yesterday. Pt complained of some bilateral knee swelling. Daughter states pt has been really week. NO chest complaints. Hx of HTN.  EXAM: RIGHT KNEE - 1-2 VIEW  COMPARISON:  None.  FINDINGS: No fracture. No bone lesion. Knee joint is normally spaced and aligned. No joint effusion. Bones are demineralized. There are dense vascular calcifications posteriorly.  IMPRESSION: No fracture or dislocation.   Electronically Signed   By: Lajean Manes M.D.   On: 08/30/2014 20:57    Medications:  Scheduled: . amLODipine  10 mg Oral Daily  . cefTRIAXone (ROCEPHIN)  IV  1 g Intravenous QHS  . cholecalciferol  5,000 Units Oral Daily  . cloNIDine  0.1 mg Oral BID  . enoxaparin (LOVENOX) injection  30 mg Subcutaneous Q24H  . ferrous fumarate  1 tablet Oral BID  . lipase/protease/amylase  36,000 Units Oral TID AC  . olopatadine  1 drop Both Eyes QHS  . sodium chloride  3 mL Intravenous Q12H    Continuous: . sodium chloride 50 mL/hr at 08/31/14 1517   [id number] alcohol  Assessment/Plan:  Principal Problem:   UTI (urinary tract infection) with pyuria Active Problems:   Hyponatremia   Hypertension   Urinary bladder cancer   S/P ileal conduit   Pancreatic insufficiency   Glaucoma   Generalized weakness    Urinary tract infection Await urine cultures. Continue IV ceftriaxone.  Generalized weakness/fatigue Likely due to recent herpes zoster and UTI. Physical and occupational therapy to evaluate. No focal deficits.  Renal failure, likely acute on chronic Creatinine back in 2014 was 1.56. Continue IV fluids for now. Monitor urine output. Repeat labs tomorrow morning.  Hyponatremia and hypokalemia Improve with IV hydration. Continue to monitor. Replace potassium cautiously.  Mildly elevated  troponin Patient denies any chest pain. EKG does not show any ischemic changes. Likely demand ischemia. Considering her advanced age, would not be too aggressive at this time.   History of essential hypertension Blood pressures were elevated last night. She is on clonidine, which has been reinitiated. Continue to monitor.  History of pancreatic insufficiency Continue pancreatic enzymes.  History of glaucoma Continue eyedrops.  Normocytic anemia Hemoglobin stable. Continue to monitor.  Recent herpes zoster Lesions are healing.  DVT Prophylaxis: Lovenox    Code Status: Full code  Family Communication: Discussed with the patient's daughter  Disposition Plan: PT and OT to evaluate. Continue current management for now. Patient lives at home with family.    LOS: 1 day   [facility] Pager [phone] 08/31/2014, 9:11 AM  If 7PM-7AM, please contact night-coverage at [email or web address], password [facility]

## 2014-08-31 NOTE — Progress Notes (Signed)
Patient presents to the floor with BP 200/63. Doctor paged. Will continue to monitor.

## 2014-08-31 NOTE — Progress Notes (Signed)
PHARMACIST - PHYSICIAN COMMUNICATION  CONCERNING: P&T Medication Policy Regarding Oral Bisphosphonates  RECOMMENDATION: Your order for alendronate (Fosamax), ibandronate (Boniva), or risedronate (Actonel) has been discontinued at this time.  If the patient's post-hospital medical condition warrants safe use of this class of drugs, please resume the pre-hospital regimen upon discharge.  DESCRIPTION:  Alendronate (Fosamax), ibandronate (Boniva), and risedronate (Actonel) can cause severe esophageal erosions in patients who are unable to remain upright at least 30 minutes after taking this medication.   Since brief interruptions in therapy are thought to have minimal impact on bone mineral density, the [facility] has established that bisphosphonate orders should be routinely discontinued during hospitalization.   To override this safety policy and permit administration of Boniva, Fosamax, or Actonel in the hospital, prescribers must write "DO NOT HOLD" in the comments section when placing the order for this class of medications.  Thanks Dorrene German 08/31/2014 1:31 AM

## 2014-09-01 DIAGNOSIS — N184 Chronic kidney disease, stage 4 (severe): Secondary | ICD-10-CM

## 2014-09-01 DIAGNOSIS — N189 Chronic kidney disease, unspecified: Secondary | ICD-10-CM

## 2014-09-01 LAB — BASIC METABOLIC PANEL
Anion gap: 4 — ABNORMAL LOW (ref 5–15)
BUN: 35 mg/dL — ABNORMAL HIGH (ref 6–20)
CHLORIDE: 103 mmol/L (ref 101–111)
CO2: 23 mmol/L (ref 22–32)
Calcium: 8 mg/dL — ABNORMAL LOW (ref 8.9–10.3)
Creatinine, Ser: 2.04 mg/dL — ABNORMAL HIGH (ref 0.44–1.00)
GFR calc non Af Amer: 20 mL/min — ABNORMAL LOW (ref >60)
GFR, EST AFRICAN AMERICAN: 23 mL/min — AB (ref >60)
Glucose, Bld: 94 mg/dL (ref 65–99)
Potassium: 4 mmol/L (ref 3.5–5.1)
Sodium: 130 mmol/L — ABNORMAL LOW (ref 135–145)

## 2014-09-01 LAB — CBC
HCT: 23.6 % — ABNORMAL LOW (ref 36.0–46.0)
HEMOGLOBIN: 8.1 g/dL — AB (ref 12.0–15.0)
MCH: 28.5 pg (ref 26.0–34.0)
MCHC: 34.3 g/dL (ref 30.0–36.0)
MCV: 83.1 fL (ref 78.0–100.0)
PLATELETS: 179 10*3/uL (ref 150–400)
RBC: 2.84 MIL/uL — ABNORMAL LOW (ref 3.87–5.11)
RDW: 14 % (ref 11.5–15.5)
WBC: 5 10*3/uL (ref 4.0–10.5)

## 2014-09-01 LAB — TROPONIN I: Troponin I: 0.09 ng/mL — ABNORMAL HIGH (ref <0.031)

## 2014-09-01 MED ORDER — HYDRALAZINE HCL 20 MG/ML IJ SOLN
10.0000 mg | Freq: Once | INTRAMUSCULAR | Status: AC
  Administered 2014-09-01: 10 mg via INTRAVENOUS
  Filled 2014-09-01: qty 1

## 2014-09-01 NOTE — Progress Notes (Signed)
[facility] PROGRESS NOTE  [name] TKP:[id number] DOB: [birth year] DOA: 08/30/2014  PCP: [name], MD  Brief HPI: 81 year old African-American female with a past medical history of hypertension, pancreatic insufficiency, with a known ileal conduit who was recently treated for herpes zoster and presented with complaints of progressive weakness, decreased appetite and confusion. She was noted to have a urinary tract infection and was hospitalized.  Past medical history:  Past Medical History  Diagnosis Date  . Hypertension   . Shingles     Consultants: None  Procedures: None  Antibiotics: Ceftriaxone  Subjective: Patient feels better. Daughter is at bedside. She thinks patient is looking much better this morning. She denies any complaints.   Objective: Vital Signs  Filed Vitals:   08/31/14 1430 08/31/14 2132 09/01/14 0400 09/01/14 0650  BP: 158/57 184/69 178/58 162/51  Pulse: 58 72  85  Temp: 98.6 F (37 C) 98.2 F (36.8 C)  98.3 F (36.8 C)  TempSrc: Oral Oral  Oral  Resp: 18 18  18   Height:      Weight:      SpO2: 100% 100%  99%    Intake/Output Summary (Last 24 hours) at 09/01/14 5170 Last data filed at 09/01/14 0555  Gross per 24 hour  Intake    850 ml  Output   1600 ml  Net   -750 ml   Filed Weights   08/30/14 1905 08/31/14 0135  Weight: 42.185 kg (93 lb) 52 kg (114 lb 10.2 oz)    General appearance: alert, cooperative, appears stated age and no distress Resp: Improved air entry bilaterally Cardio: S1, S2 is normal, regular. Systolic murmur appreciated over the precordium. No S3, S4. No rubs, or bruit. No pedal edema. GI: soft, non-tender; bowel sounds normal; no masses,  no organomegaly and Ileal conduit noted on the left draining yellow urine Extremities: extremities normal, atraumatic, no cyanosis or edema Skin exam: No vesicles noted at the site of her herpes zoster in the left lower abdomen and back. Lesions appear to be  healing. Neurologic: Generalized weakness appreciated without any focal deficits.  Lab Results:  Basic Metabolic Panel:  Recent Labs Lab 08/30/14 1930 08/31/14 0205 09/01/14 0430  NA 127* 130* 130*  K 3.6 3.2* 4.0  CL 98* 100* 103  CO2 22 22 23   GLUCOSE 100* 116* 94  BUN 38* 35* 35*  CREATININE 2.40* 2.31* 2.04*  CALCIUM 8.4* 8.4* 8.0*   Liver Function Tests:  Recent Labs Lab 08/30/14 1928 08/31/14 0205  AST 52* 53*  ALT 13* 16  ALKPHOS 33* 36*  BILITOT 0.4 0.4  PROT 6.0* 6.1*  ALBUMIN 3.2* 2.9*   CBC:  Recent Labs Lab 08/30/14 1930 08/31/14 0205 09/01/14 0430  WBC 4.5 4.9 5.0  NEUTROABS  --  2.7  --   HGB 8.4* 9.9* 8.1*  HCT 24.2* 28.5* 23.6*  MCV 82.3 82.6 83.1  PLT 193 203 179   Cardiac Enzymes:  Recent Labs Lab 08/30/14 2039 08/31/14 0205 08/31/14 0815 08/31/14 1450 09/01/14 0430  TROPONINI 0.17* 0.14* 0.15* 0.14* 0.09*   BNP (last 3 results)  Recent Labs  08/30/14 1928  BNP 395.6*    CBG:  Recent Labs Lab 08/30/14 1942  GLUCAP 94    Recent Results (from the past 240 hour(s))  Urine culture     Status: None (Preliminary result)   Collection Time: 08/30/14 10:40 PM  Result Value Ref Range Status   Specimen Description URINE, RANDOM  Final   Special  Requests Normal  Final   Culture   Final    TOO YOUNG TO READ Performed at [facility]    Report Status PENDING  Incomplete      Studies/Results: Dg Chest 2 View  08/30/2014   CLINICAL DATA:  Pain following fall  EXAM: CHEST  2 VIEW  COMPARISON:  October 01, 2012  FINDINGS: There is atelectasis in the left base. The lungs elsewhere clear. Heart size and pulmonary vascularity are normal. No adenopathy. There is lower thoracic levoscoliosis. No pneumothorax.  IMPRESSION: Left base atelectasis. No edema or consolidation. Marked thoracolumbar levoscoliosis.   Electronically Signed   By: Lowella Grip III M.D.   On: 08/30/2014 20:59   Dg Pelvis 1-2 Views  08/30/2014    CLINICAL DATA:  Daughter states pt slipped out of wheelchair yesterday. Pt complained of some bilateral knee swelling. Daughter states pt has been really week. NO chest complaints. Hx of HTN.  EXAM: PELVIS - 1-2 VIEW  COMPARISON:  CT, 10/01/2012  FINDINGS: No fracture. Hip joints, SI joints and symphysis pubis are normally aligned. Bones are diffusely demineralized.  There are numerous surgical vascular calcifications and bowel anastomosis staples in the low abdomen and pelvis. Dense femoral and iliac artery vascular calcifications are noted.  IMPRESSION: 1. No fracture or dislocation.  No acute finding.   Electronically Signed   By: Lajean Manes M.D.   On: 08/30/2014 20:58   Dg Knee 2 Views Left  08/30/2014   CLINICAL DATA:  Daughter states pt slipped out of wheelchair yesterday. Pt complained of some bilateral knee swelling. Daughter states pt has been really week. NO chest complaints. Hx of HTN.  EXAM: LEFT KNEE - 1-2 VIEW  COMPARISON:  None.  FINDINGS: No fracture. Knee joint is normally aligned. There is medial joint space compartment narrowing. Bones are demineralized. No bone lesion.  No joint effusion. There are dense vascular calcifications posteriorly.  IMPRESSION: No fracture or dislocation.   Electronically Signed   By: Lajean Manes M.D.   On: 08/30/2014 20:56   Dg Knee 2 Views Right  08/30/2014   CLINICAL DATA:  Daughter states pt slipped out of wheelchair yesterday. Pt complained of some bilateral knee swelling. Daughter states pt has been really week. NO chest complaints. Hx of HTN.  EXAM: RIGHT KNEE - 1-2 VIEW  COMPARISON:  None.  FINDINGS: No fracture. No bone lesion. Knee joint is normally spaced and aligned. No joint effusion. Bones are demineralized. There are dense vascular calcifications posteriorly.  IMPRESSION: No fracture or dislocation.   Electronically Signed   By: Lajean Manes M.D.   On: 08/30/2014 20:57    Medications:  Scheduled: . amLODipine  10 mg Oral Daily  .  cefTRIAXone (ROCEPHIN)  IV  1 g Intravenous QHS  . cholecalciferol  5,000 Units Oral Daily  . cloNIDine  0.1 mg Oral BID  . enoxaparin (LOVENOX) injection  30 mg Subcutaneous Q24H  . ferrous fumarate  1 tablet Oral BID  . lipase/protease/amylase  36,000 Units Oral TID AC  . olopatadine  1 drop Both Eyes QHS  . sodium chloride  3 mL Intravenous Q12H   Continuous: . sodium chloride 50 mL/hr at 09/01/14 0550   YQM:GNOIBBCWU alcohol  Assessment/Plan:  Principal Problem:   UTI (urinary tract infection) with pyuria Active Problems:   Hyponatremia   Hypertension   Chronic kidney disease, stage 4, severely decreased GFR   Urinary bladder cancer   S/P ileal conduit   Pancreatic insufficiency  Glaucoma   Generalized weakness   Acute on chronic renal failure    Urinary tract infection Await urine cultures. Continue IV ceftriaxone.  Generalized weakness/fatigue Likely due to recent herpes zoster and UTI. Physical and occupational therapy to evaluate. No focal deficits.  Renal failure, likely acute on chronic Improving slowly. Creatinine back in 2014 was 1.56. Continue IV fluids for now. Monitor urine output. Repeat labs tomorrow morning.  Hyponatremia and hypokalemia Improve with IV hydration. Continue to monitor. Potassium is better. Continue IV fluids  Mildly elevated troponin Patient denies any chest pain. EKG does not show any ischemic changes. Likely demand ischemia. Considering her advanced age, would not be too aggressive at this time.   History of essential hypertension Blood pressures are better. Considering her advanced age, we will not be too aggressive with blood pressure management. Continue current treatment. Continue to monitor.   History of pancreatic insufficiency Continue pancreatic enzymes.  History of glaucoma Continue eyedrops.  Normocytic anemia Hemoglobin stable. Continue to monitor.  Recent herpes zoster Lesions are healing.  DVT Prophylaxis:  Lovenox    Code Status: Full code  Family Communication: Discussed with the patient's daughter  Disposition Plan: PT and OT to evaluate. Continue current management for now. Patient lives at home with family.    LOS: 2 days   [facility] Pager [phone] 09/01/2014, 8:22 AM  If 7PM-7AM, please contact night-coverage at [email or web address], password [facility]

## 2014-09-01 NOTE — Evaluation (Signed)
Physical Therapy Evaluation Patient Details Name: [name] MRN: [id number] DOB: 17-Oct-1919 Today's Date: 09/01/2014   History of Present Illness  81 year old African-American female with a past medical history of hypertension, pancreatic insufficiency, with a known ileal conduit who was recently treated for herpes zoster and presented with complaints of progressive weakness, decreased appetite and confusion. She was noted to have a urinary tract infection   Clinical Impression  Pt admitted as above and presenting with functional mobility limitations 2* generalized weakness and ambulatory instability.  Pt should progress to dc home with family assist and HHPT follow up.    Follow Up Recommendations Home health PT    Equipment Recommendations  None recommended by PT    Recommendations for Other Services OT consult     Precautions / Restrictions Precautions Precautions: Fall Precaution Comments: urostomy and ileostomy Restrictions Weight Bearing Restrictions: No      Mobility  Bed Mobility Overal bed mobility: Needs Assistance Bed Mobility: Supine to Sit;Sit to Supine     Supine to sit: Min assist;Mod assist;+2 for physical assistance;+2 for safety/equipment Sit to supine: Min assist;Mod assist;+2 for physical assistance;+2 for safety/equipment   General bed mobility comments: cues for sequence.  Physical assist to manage LEs, to control trunk and to get to EOB with use of pad  Transfers Overall transfer level: Needs assistance Equipment used: Rolling walker (2 wheeled) Transfers: Sit to/from Stand Sit to Stand: Min assist;Mod assist;+2 physical assistance;+2 safety/equipment         General transfer comment: cues for LE management and use of UEs to self assist  Ambulation/Gait Ambulation/Gait assistance: Min assist;Mod assist;+2 safety/equipment Ambulation Distance (Feet): 70 Feet Assistive device: Rolling walker (2 wheeled) Gait Pattern/deviations:  Shuffle;Step-through pattern;Decreased step length - right;Decreased step length - left;Trunk flexed Gait velocity: decr   General Gait Details: cues for posture and position from [employer]            Wheelchair Mobility    Modified Rankin (Stroke Patients Only)       Balance Overall balance assessment: Needs assistance Sitting-balance support: No upper extremity supported Sitting balance-Leahy Scale: Good     Standing balance support: Bilateral upper extremity supported Standing balance-Leahy Scale: Poor                               Pertinent Vitals/Pain Pain Assessment: Faces Faces Pain Scale: Hurts little more Pain Location: back and posterior aspect of R lower leg. Pain Descriptors / Indicators: Sore;Grimacing Pain Intervention(s): Monitored during session;Repositioned    Home Living Family/patient expects to be discharged to:: Private residence Living Arrangements: Children Available Help at Discharge: Family Type of Home: House Home Access: Stairs to enter   Technical brewer of Steps: 1 Home Layout: One level Home Equipment: Environmental consultant - 2 wheels;Shower seat;Toilet riser      Prior Function Level of Independence: Needs assistance      ADL's / Homemaking Assistance Needed: daughter states she helps with household tasks, laundry and more recently with her showers.         Hand Dominance   Dominant Hand: Right    Extremity/Trunk Assessment   Upper Extremity Assessment: Generalized weakness (R hand 2nd digit difficulty with fully extending digit. )           Lower Extremity Assessment: Generalized weakness         Communication   Communication: No difficulties  Cognition Arousal/Alertness: Awake/alert Behavior During Therapy: [facility]  for tasks assessed/performed Overall Cognitive Status: Within Functional Limits for tasks assessed                      General Comments      Exercises         Assessment/Plan    PT Assessment Patient needs continued PT services  PT Diagnosis Difficulty walking   PT Problem List Decreased strength;Decreased range of motion;Decreased activity tolerance;Decreased mobility;Decreased knowledge of use of DME;Pain  PT Treatment Interventions DME instruction;Gait training;Stair training;Functional mobility training;Therapeutic activities;Therapeutic exercise;Patient/family education   PT Goals (Current goals can be found in the Care Plan section) Acute Rehab PT Goals Patient Stated Goal: Home PT Goal Formulation: With patient Time For Goal Achievement: 09/15/14 Potential to Achieve Goals: Good    Frequency Min 3X/week   Barriers to discharge        Co-evaluation   Reason for Co-Treatment: For patient/therapist safety   OT goals addressed during session: ADL's and self-care;Proper use of Adaptive equipment and DME       End of Session Equipment Utilized During Treatment: Gait belt Activity Tolerance: Patient tolerated treatment well Patient left: in bed;with call bell/phone within reach;with family/visitor present Nurse Communication: Mobility status         Time: 1505-1601 PT Time Calculation (min) (ACUTE ONLY): 56 min   Charges:   PT Evaluation $Initial PT Evaluation Tier I: 1 Procedure PT Treatments $Gait Training: 8-22 mins   PT G Codes:        Marion Seese 2014/09/11, 4:30 PM

## 2014-09-01 NOTE — Evaluation (Signed)
Occupational Therapy Evaluation Patient Details Name: [name] MRN: [id number] DOB: 09/12/1919 Today's Date: 09/01/2014    History of Present Illness 81 year old African-American female with a past medical history of hypertension, pancreatic insufficiency, with a known ileal conduit who was recently treated for herpes zoster and presented with complaints of progressive weakness, decreased appetite and confusion. She was noted to have a urinary tract infection    Clinical Impression   Pt up with +2 assist to [facility] for BM and assisted with periarea hygiene in standing. Family present and supportive with plan to return home with daughter assisting. Will follow on acute to progress ADL independence for return home.    Follow Up Recommendations  Home health OT;Supervision/Assistance - 24 hour    Equipment Recommendations  3 in 1 bedside comode    Recommendations for Other Services       Precautions / Restrictions Precautions Precautions: Fall Precaution Comments: urostomy and ileostomy Restrictions Weight Bearing Restrictions: No      Mobility Bed Mobility Overal bed mobility: Needs Assistance Bed Mobility: Supine to Sit;Sit to Supine     Supine to sit: Min assist;Mod assist;+2 for physical assistance;+2 for safety/equipment Sit to supine: Min assist;Mod assist;+2 for physical assistance;+2 for safety/equipment   General bed mobility comments: cues for sequence.  Physical assist to manage LEs, to control trunk and to get to EOB with use of pad  Transfers Overall transfer level: Needs assistance Equipment used: Rolling walker (2 wheeled) Transfers: Sit to/from Stand Sit to Stand: Min assist;Mod assist;+2 physical assistance;+2 safety/equipment         General transfer comment: cues for LE management and use of UEs to self assist    Balance Overall balance assessment: Needs assistance Sitting-balance support: No upper extremity supported Sitting balance-Leahy Scale:  Good     Standing balance support: Bilateral upper extremity supported Standing balance-Leahy Scale: Poor                              ADL Overall ADL's : Needs assistance/impaired Eating/Feeding: Set up;Bed level   Grooming: Wash/dry hands;Set up;Sitting   Upper Body Bathing: Minimal assitance;Sitting   Lower Body Bathing: +2 for physical assistance;Maximal assistance;Sit to/from stand   Upper Body Dressing : Minimal assistance;Sitting   Lower Body Dressing: +2 for physical assistance;Maximal assistance;Sit to/from stand Lower Body Dressing Details (indicate cue type and reason): required assist with donning shoes today. +2 min/mod assist for sit to stand aspect. Toilet Transfer: +2 for physical assistance;Minimal assistance;Moderate assistance;RW;Ambulation   Toileting- Clothing Manipulation and Hygiene: +2 for physical assistance;Moderate assistance;Sit to/from stand Toileting - Clothing Manipulation Details (indicate cue type and reason): pt able to perform some of hygiene sitting down but required assist for thoroughness after BM in standing.       General ADL Comments: Pt up to [facility] for BM. Requires increased time to complete hygiene. Pt attemping to help clean while seated and then needed assist in standing for thoroughness. Cotx with PT with ambulation in hallway after toileting task. Daughter present and very supportive. She states it take pt about an hour to eat a meal even at home.      Vision     Perception     Praxis      Pertinent Vitals/Pain Pain Assessment: Faces Faces Pain Scale: Hurts little more Pain Location: back and posterior aspect of R lower leg. Pain Descriptors / Indicators: Sore;Grimacing Pain Intervention(s): Monitored during session;Repositioned  Hand Dominance Right   Extremity/Trunk Assessment Upper Extremity Assessment Upper Extremity Assessment: Generalized weakness (R hand 2nd digit difficulty with fully extending  digit. )          Communication Communication Communication: No difficulties   Cognition Arousal/Alertness: Awake/alert Behavior During Therapy: WFL for tasks assessed/performed Overall Cognitive Status: Within Functional Limits for tasks assessed                     General Comments       Exercises       Shoulder Instructions      Home Living Family/patient expects to be discharged to:: Private residence Living Arrangements: Children Available Help at Discharge: Family Type of Home: House Home Access: Stairs to enter Technical brewer of Steps: 1   Home Layout: One level     Bathroom Shower/Tub: Teacher, early years/pre: Standard     Home Equipment: Environmental consultant - 2 wheels;Shower seat;Toilet riser          Prior Functioning/Environment Level of Independence: Needs assistance    ADL's / Homemaking Assistance Needed: daughter states she helps with household tasks, laundry and more recently with her showers.         OT Diagnosis: Generalized weakness   OT Problem List: Decreased strength;Decreased knowledge of use of DME or AE   OT Treatment/Interventions: Self-care/ADL training;Patient/family education;Therapeutic activities;DME and/or AE instruction    OT Goals(Current goals can be found in the care plan section) Acute Rehab OT Goals Patient Stated Goal: Home OT Goal Formulation: With patient/family Time For Goal Achievement: 09/15/14 Potential to Achieve Goals: Good  OT Frequency: Min 2X/week   Barriers to D/C:            Co-evaluation PT/OT/SLP Co-Evaluation/Treatment: Yes Reason for Co-Treatment: For patient/therapist safety   OT goals addressed during session: ADL's and self-care;Proper use of Adaptive equipment and DME      End of Session Equipment Utilized During Treatment: Gait belt;Rolling walker  Activity Tolerance: Patient tolerated treatment well Patient left: with call bell/phone within reach;with  family/visitor present;in bed   Time: 1505-1601 OT Time Calculation (min): 56 min Charges:  OT General Charges $OT Visit: 1 Procedure OT Evaluation $Initial OT Evaluation Tier I: 1 Procedure OT Treatments $Self Care/Home Management : 8-22 mins G-Codes:    Jules Schick  [phone] 09/01/2014, 4:34 PM

## 2014-09-01 NOTE — Plan of Care (Signed)
Problem: Phase I Progression Outcomes Goal: Hemodynamically stable Outcome: Not Progressing Lab work remains outside of limits  Problem: Phase II Progression Outcomes Goal: Vital signs remain stable Outcome: Progressing BP is trending down this shift

## 2014-09-02 LAB — BASIC METABOLIC PANEL
ANION GAP: 6 (ref 5–15)
BUN: 34 mg/dL — AB (ref 6–20)
CO2: 22 mmol/L (ref 22–32)
Calcium: 8 mg/dL — ABNORMAL LOW (ref 8.9–10.3)
Chloride: 100 mmol/L — ABNORMAL LOW (ref 101–111)
Creatinine, Ser: 1.93 mg/dL — ABNORMAL HIGH (ref 0.44–1.00)
GFR calc non Af Amer: 21 mL/min — ABNORMAL LOW (ref >60)
GFR, EST AFRICAN AMERICAN: 25 mL/min — AB (ref >60)
GLUCOSE: 95 mg/dL (ref 65–99)
Potassium: 4 mmol/L (ref 3.5–5.1)
Sodium: 128 mmol/L — ABNORMAL LOW (ref 135–145)

## 2014-09-02 LAB — CBC
HEMATOCRIT: 24.7 % — AB (ref 36.0–46.0)
HEMOGLOBIN: 8.6 g/dL — AB (ref 12.0–15.0)
MCH: 29 pg (ref 26.0–34.0)
MCHC: 34.8 g/dL (ref 30.0–36.0)
MCV: 83.2 fL (ref 78.0–100.0)
Platelets: 192 10*3/uL (ref 150–400)
RBC: 2.97 MIL/uL — ABNORMAL LOW (ref 3.87–5.11)
RDW: 14.3 % (ref 11.5–15.5)
WBC: 5.3 10*3/uL (ref 4.0–10.5)

## 2014-09-02 MED ORDER — SODIUM CHLORIDE 0.9 % IV SOLN
INTRAVENOUS | Status: DC
  Administered 2014-09-02 – 2014-09-04 (×4): via INTRAVENOUS

## 2014-09-02 NOTE — Progress Notes (Signed)
[facility] PROGRESS NOTE  [name] URK:[id number] DOB: Jul 30, 1919 DOA: 08/30/2014  PCP: [name], MD  Brief HPI: 81 year old African-American female with a past medical history of hypertension, pancreatic insufficiency, with a known ileal conduit who was recently treated for herpes zoster and presented with complaints of progressive weakness, decreased appetite and confusion. She was noted to have a urinary tract infection and was hospitalized.  Past medical history:  Past Medical History  Diagnosis Date  . Hypertension   . Shingles     Consultants: None  Procedures: None  Antibiotics: Ceftriaxone  Subjective: Patient continues to feel better. Denies any new complaints today. Tolerating her diet. Her granddaughter is at bedside.   Objective: Vital Signs  Filed Vitals:   09/01/14 0650 09/01/14 1442 09/01/14 2105 09/02/14 0450  BP: 162/51 166/55 188/59 177/51  Pulse: 85 77 80 68  Temp: 98.3 F (36.8 C) 98.2 F (36.8 C) 98.4 F (36.9 C) 98 F (36.7 C)  TempSrc: Oral Oral Oral Axillary  Resp: 18 16 16 16   Height:      Weight:      SpO2: 99% 100% 100% 100%    Intake/Output Summary (Last 24 hours) at 09/02/14 0813 Last data filed at 09/02/14 0600  Gross per 24 hour  Intake   1373 ml  Output   1300 ml  Net     73 ml   Filed Weights   08/30/14 1905 08/31/14 0135  Weight: 42.185 kg (93 lb) 52 kg (114 lb 10.2 oz)    General appearance: alert, cooperative, appears stated age and no distress Resp: Good air entry bilaterally Cardio: S1, S2 is normal, regular. Systolic murmur appreciated over the precordium. No S3, S4. No rubs, or bruit. No pedal edema. GI: soft, non-tender; bowel sounds normal; no masses,  no organomegaly and Ileal conduit noted on the left draining yellow urine Extremities: extremities normal, atraumatic, no cyanosis or edema Skin exam: No vesicles noted at the site of her herpes zoster in the left lower abdomen and back.  Lesions appear to be healing. Neurologic: Generalized weakness appreciated without any focal deficits.  Lab Results:  Basic Metabolic Panel:  Recent Labs Lab 08/30/14 1930 08/31/14 0205 09/01/14 0430 09/02/14 0511  NA 127* 130* 130* 128*  K 3.6 3.2* 4.0 4.0  CL 98* 100* 103 100*  CO2 22 22 23 22   GLUCOSE 100* 116* 94 95  BUN 38* 35* 35* 34*  CREATININE 2.40* 2.31* 2.04* 1.93*  CALCIUM 8.4* 8.4* 8.0* 8.0*   Liver Function Tests:  Recent Labs Lab 08/30/14 1928 08/31/14 0205  AST 52* 53*  ALT 13* 16  ALKPHOS 33* 36*  BILITOT 0.4 0.4  PROT 6.0* 6.1*  ALBUMIN 3.2* 2.9*   CBC:  Recent Labs Lab 08/30/14 1930 08/31/14 0205 09/01/14 0430 09/02/14 0511  WBC 4.5 4.9 5.0 5.3  NEUTROABS  --  2.7  --   --   HGB 8.4* 9.9* 8.1* 8.6*  HCT 24.2* 28.5* 23.6* 24.7*  MCV 82.3 82.6 83.1 83.2  PLT 193 203 179 192   Cardiac Enzymes:  Recent Labs Lab 08/30/14 2039 08/31/14 0205 08/31/14 0815 08/31/14 1450 09/01/14 0430  TROPONINI 0.17* 0.14* 0.15* 0.14* 0.09*   BNP (last 3 results)  Recent Labs  08/30/14 1928  BNP 395.6*    CBG:  Recent Labs Lab 08/30/14 1942  GLUCAP 94    Recent Results (from the past 240 hour(s))  Urine culture     Status: None (Preliminary result)  Collection Time: 08/30/14 10:40 PM  Result Value Ref Range Status   Specimen Description URINE, RANDOM  Final   Special Requests Normal  Final   Culture   Final    >=100,000 COLONIES/mL GRAM NEGATIVE RODS Performed at [facility]    Report Status PENDING  Incomplete      Studies/Results: No results found.  Medications:  Scheduled: . amLODipine  10 mg Oral Daily  . cefTRIAXone (ROCEPHIN)  IV  1 g Intravenous QHS  . cholecalciferol  5,000 Units Oral Daily  . cloNIDine  0.1 mg Oral BID  . enoxaparin (LOVENOX) injection  30 mg Subcutaneous Q24H  . ferrous fumarate  1 tablet Oral BID  . lipase/protease/amylase  36,000 Units Oral TID AC  . olopatadine  1 drop Both Eyes QHS   . sodium chloride  3 mL Intravenous Q12H   Continuous: . sodium chloride     ZOX:[id number] alcohol  Assessment/Plan:  Principal Problem:   UTI (urinary tract infection) with pyuria Active Problems:   Hyponatremia   Hypertension   Chronic kidney disease, stage 4, severely decreased GFR   Urinary bladder cancer   S/P ileal conduit   Pancreatic insufficiency   Glaucoma   Generalized weakness   Acute on chronic renal failure    Urinary tract infection Await final urine cultures. Continue IV ceftriaxone.  Generalized weakness/fatigue Likely due to recent herpes zoster and UTI. Physical and occupational therapy has evaluated. Home health has been recommended. Patient does not have any focal deficits.  Renal failure, likely acute on chronic Improving slowly. Creatinine back in 2014 was 1.56. Continue IV fluids for now. Monitor urine output. Repeat labs tomorrow morning.  Hyponatremia and hypokalemia Improve with IV hydration. Continue to monitor. Potassium is better. Continue IV fluids. Old labs reviewed. She has chronic hyponatremia.  Mildly elevated troponin Patient denies any chest pain. EKG does not show any ischemic changes. Likely demand ischemia. Considering her advanced age, would not be too aggressive at this time.   History of essential hypertension Blood pressures are better. Considering her advanced age, we will not be too aggressive with blood pressure management. Continue current treatment. Continue to monitor.   History of pancreatic insufficiency Continue pancreatic enzymes.  History of glaucoma Continue eyedrops.  Normocytic anemia Hemoglobin stable. Continue to monitor.  Recent herpes zoster Lesions are healing.  DVT Prophylaxis: Lovenox    Code Status: Full code  Family Communication: Discussed with the patient's daughter  Disposition Plan: PT and OT. Continue current management for now. Patient lives at home with family. Change to oral  antibiotics once final urine culture report is available.    LOS: 3 days   [facility] Pager [phone] 09/02/2014, 8:13 AM  If 7PM-7AM, please contact night-coverage at [email or web address], password [facility]

## 2014-09-03 LAB — CBC
HCT: 24.6 % — ABNORMAL LOW (ref 36.0–46.0)
HEMOGLOBIN: 8.2 g/dL — AB (ref 12.0–15.0)
MCH: 27.9 pg (ref 26.0–34.0)
MCHC: 33.3 g/dL (ref 30.0–36.0)
MCV: 83.7 fL (ref 78.0–100.0)
PLATELETS: 189 10*3/uL (ref 150–400)
RBC: 2.94 MIL/uL — AB (ref 3.87–5.11)
RDW: 14.3 % (ref 11.5–15.5)
WBC: 4.7 10*3/uL (ref 4.0–10.5)

## 2014-09-03 LAB — BASIC METABOLIC PANEL
ANION GAP: 7 (ref 5–15)
BUN: 33 mg/dL — ABNORMAL HIGH (ref 6–20)
CHLORIDE: 100 mmol/L — AB (ref 101–111)
CO2: 22 mmol/L (ref 22–32)
Calcium: 8.2 mg/dL — ABNORMAL LOW (ref 8.9–10.3)
Creatinine, Ser: 2.05 mg/dL — ABNORMAL HIGH (ref 0.44–1.00)
GFR calc non Af Amer: 20 mL/min — ABNORMAL LOW (ref >60)
GFR, EST AFRICAN AMERICAN: 23 mL/min — AB (ref >60)
Glucose, Bld: 140 mg/dL — ABNORMAL HIGH (ref 65–99)
POTASSIUM: 3.9 mmol/L (ref 3.5–5.1)
SODIUM: 129 mmol/L — AB (ref 135–145)

## 2014-09-03 LAB — URINE CULTURE
Culture: >=100000
SPECIAL REQUESTS: NORMAL

## 2014-09-03 MED ORDER — CIPROFLOXACIN HCL 500 MG PO TABS
500.0000 mg | ORAL_TABLET | Freq: Every day | ORAL | Status: DC
  Administered 2014-09-03 – 2014-09-04 (×2): 500 mg via ORAL
  Filled 2014-09-03 (×2): qty 1

## 2014-09-03 MED ORDER — DOCUSATE SODIUM 100 MG PO CAPS
100.0000 mg | ORAL_CAPSULE | Freq: Two times a day (BID) | ORAL | Status: DC
  Administered 2014-09-03 – 2014-09-04 (×3): 100 mg via ORAL
  Filled 2014-09-03 (×3): qty 1

## 2014-09-03 MED ORDER — CLONIDINE HCL 0.1 MG PO TABS
0.1000 mg | ORAL_TABLET | Freq: Three times a day (TID) | ORAL | Status: DC
  Administered 2014-09-03 – 2014-09-04 (×4): 0.1 mg via ORAL
  Filled 2014-09-03 (×4): qty 1

## 2014-09-03 NOTE — Progress Notes (Signed)
[facility] PROGRESS NOTE  [name] YSA:[id number] DOB: 10/12/1919 DOA: 08/30/2014  PCP: [name], MD  Brief HPI: 81 year old African-American female with a past medical history of hypertension, pancreatic insufficiency, with a known ileal conduit who was recently treated for herpes zoster and presented with complaints of progressive weakness, decreased appetite and confusion. She was noted to have a urinary tract infection and was hospitalized.  Past medical history:  Past Medical History  Diagnosis Date  . Hypertension   . Shingles     Consultants: None  Procedures: None  Antibiotics: Ceftriaxone till 8/15 Cipro 8/15  Subjective: Patient feels about the same. Her daughter is at bedside. She states that her appetite is improved. Daughter feels that the patient is much better. No nausea, vomiting. Requesting a stool softener.   Objective: Vital Signs  Filed Vitals:   09/02/14 0844 09/02/14 1338 09/02/14 2152 09/03/14 0613  BP: 177/64 169/55 174/62 182/53  Pulse: 71 69 69 66  Temp:  98.2 F (36.8 C) 98 F (36.7 C) 98 F (36.7 C)  TempSrc:  Oral Oral Oral  Resp:  16 18 16   Height:      Weight:      SpO2:  100% 99% 99%    Intake/Output Summary (Last 24 hours) at 09/03/14 0821 Last data filed at 09/03/14 3235  Gross per 24 hour  Intake    600 ml  Output   1975 ml  Net  -1375 ml   Filed Weights   08/30/14 1905 08/31/14 0135  Weight: 42.185 kg (93 lb) 52 kg (114 lb 10.2 oz)    General appearance: alert, cooperative, appears stated age and no distress Resp: Good air entry bilaterally Cardio: S1, S2 is normal, regular. Systolic murmur appreciated over the precordium. No S3, S4. No rubs, or bruit. No pedal edema. GI: soft, non-tender; bowel sounds normal; no masses,  no organomegaly and Ileal conduit noted on the left draining yellow urine Skin exam: No vesicles noted at the site of her herpes zoster in the left lower abdomen and back. Lesions  appear to be healing. Neurologic: Generalized weakness appreciated without any focal deficits.  Lab Results:  Basic Metabolic Panel:  Recent Labs Lab 08/30/14 1930 08/31/14 0205 09/01/14 0430 09/02/14 0511 09/03/14 0440  NA 127* 130* 130* 128* 129*  K 3.6 3.2* 4.0 4.0 3.9  CL 98* 100* 103 100* 100*  CO2 22 22 23 22 22   GLUCOSE 100* 116* 94 95 140*  BUN 38* 35* 35* 34* 33*  CREATININE 2.40* 2.31* 2.04* 1.93* 2.05*  CALCIUM 8.4* 8.4* 8.0* 8.0* 8.2*   Liver Function Tests:  Recent Labs Lab 08/30/14 1928 08/31/14 0205  AST 52* 53*  ALT 13* 16  ALKPHOS 33* 36*  BILITOT 0.4 0.4  PROT 6.0* 6.1*  ALBUMIN 3.2* 2.9*   CBC:  Recent Labs Lab 08/30/14 1930 08/31/14 0205 09/01/14 0430 09/02/14 0511 09/03/14 0440  WBC 4.5 4.9 5.0 5.3 4.7  NEUTROABS  --  2.7  --   --   --   HGB 8.4* 9.9* 8.1* 8.6* 8.2*  HCT 24.2* 28.5* 23.6* 24.7* 24.6*  MCV 82.3 82.6 83.1 83.2 83.7  PLT 193 203 179 192 189   Cardiac Enzymes:  Recent Labs Lab 08/30/14 2039 08/31/14 0205 08/31/14 0815 08/31/14 1450 09/01/14 0430  TROPONINI 0.17* 0.14* 0.15* 0.14* 0.09*   BNP (last 3 results)  Recent Labs  08/30/14 1928  BNP 395.6*    CBG:  Recent Labs Lab 08/30/14 1942  GLUCAP 94    Recent Results (from the past 240 hour(s))  Urine culture     Status: None (Preliminary result)   Collection Time: 08/30/14 10:40 PM  Result Value Ref Range Status   Specimen Description URINE, RANDOM  Final   Special Requests Normal  Final   Culture   Final    >=100,000 COLONIES/mL GRAM NEGATIVE RODS Performed at [facility]    Report Status PENDING  Incomplete      Studies/Results: No results found.  Medications:  Scheduled: . amLODipine  10 mg Oral Daily  . cefTRIAXone (ROCEPHIN)  IV  1 g Intravenous QHS  . cholecalciferol  5,000 Units Oral Daily  . cloNIDine  0.1 mg Oral TID  . enoxaparin (LOVENOX) injection  30 mg Subcutaneous Q24H  . ferrous fumarate  1 tablet Oral BID  .  lipase/protease/amylase  36,000 Units Oral TID AC  . olopatadine  1 drop Both Eyes QHS  . sodium chloride  3 mL Intravenous Q12H   Continuous: . sodium chloride 50 mL/hr at 09/03/14 0216   XAJ:OINOMVEHM alcohol  Assessment/Plan:  Principal Problem:   UTI (urinary tract infection) with pyuria Active Problems:   Hyponatremia   Hypertension   Chronic kidney disease, stage 4, severely decreased GFR   Urinary bladder cancer   S/P ileal conduit   Pancreatic insufficiency   Glaucoma   Generalized weakness   Acute on chronic renal failure    Urinary tract infection Urine cultures positive for Klebsiella and providencia. Sensitivities noted. Change antibiotics to ciprofloxacin. To be dosed by pharmacy for renal adjustment.   Generalized weakness/fatigue Likely due to recent herpes zoster and UTI. Physical and occupational therapy has evaluated. Home health has been recommended. Patient does not have any focal deficits. She is better.  Renal failure, likely acute on chronic Improving slowly. Creatinine back in 2014 was 1.56. This could be her new baseline. We will not be too aggressive with IV fluids. And tinea to monitor urine output. Repeat labs tomorrow morning.  Hyponatremia and hypokalemia Potassium is normal. Sodium level remains low. Old labs reviewed. She appears to have chronic hyponatremia.   Mildly elevated troponin Patient denies any chest pain. EKG does not show any ischemic changes. Likely demand ischemia. Considering her advanced age, would not be too aggressive at this time.   History of essential hypertension Blood pressures still into the 180s at times. Considering her advanced age, we will not be too aggressive with blood pressure management. But there could be some benefit in increasing the dose of her clonidine, which we will do today. Continue to monitor.   History of pancreatic insufficiency Continue pancreatic enzymes.  History of glaucoma Continue  eyedrops.  Normocytic anemia Hemoglobin stable. Continue to monitor.  Recent herpes zoster Lesions are healing.  DVT Prophylaxis: Lovenox    Code Status: Full code  Family Communication: Discussed with the patient's daughter  Disposition Plan: PT and OT has seen. Home health recommended. Change to oral antibiotics. If she remains stable, she could be discharged tomorrow.    LOS: 4 days   [facility] Pager [phone] 09/03/2014, 8:21 AM  If 7PM-7AM, please contact night-coverage at [email or web address], password [facility]

## 2014-09-03 NOTE — Progress Notes (Signed)
Occupational Therapy Treatment Patient Details Name: [name] MRN: [id number] DOB: November 20, 1919 Today's Date: 09/03/2014    History of present illness 81 year old African-American female with a past medical history of hypertension, pancreatic insufficiency, with a known ileal conduit who was recently treated for herpes zoster and presented with complaints of progressive weakness, decreased appetite and confusion. She was noted to have a urinary tract infection    OT comments  Pt up with nursing from [facility] when OT arrived. Pt sat in chair and worked on [employer] dressing of doffing socks and donning shoes. Pt's daughter would like pt to be able to do as much as she can for herself and was encouraging pt to attempt donning shoes also. Pt requires increased time but was able to doff socks and don shoes with mod assist. Practiced one repetition of sit to stand from chair with daughter providing some hands on assist also. Discussed at length tub DMe options and 3in1.    Follow Up Recommendations  Home health OT;Supervision/Assistance - 24 hour    Equipment Recommendations  3 in 1 bedside comode  (pt's daughter would like to discuss tubbench once home with HHOT to see if it will fit in bathroom)   Recommendations for Other Services      Precautions / Restrictions Precautions Precautions: Fall Precaution Comments: urostomy and ileostomy       Mobility Bed Mobility               General bed mobility comments: pt OOB with nursing.  Transfers Overall transfer level: Needs assistance Equipment used: Rolling walker (2 wheeled) Transfers: Sit to/from Stand Sit to Stand: +2 safety/equipment;+2 physical assistance;Min assist;Mod assist         General transfer comment: cues for hand placement and use of UEs.    Balance                                   ADL                       Lower Body Dressing: Moderate assistance;Sitting/lateral leans Lower Body Dressing  Details (indicate cue type and reason): mod assist and much increased time to don tennis shoes and tie laces. Pt with effort to lean forward and perform LB self care. Pt wanting to continue to loosen laces yet laces already fairly loose to slide foot in. Needed some assist to lift each foot off floor for her to be able to push sock off around heel.  Toilet Transfer: +2 for safety/equipment;Minimal assistance;Moderate assistance;RW;+2 for physical assistance             General ADL Comments: Discussed DME at length with daughter. Offered suggestion of a tub transfer bench if pt would like to be able to shower rather than sponge bathe again. Daughter states there may not be enough room in bathroom for the tubbench however. Educated on option also of 3in1 to have armrests to help her with pushing up from commode. And explained use as BSC if needed. She only has a riser at home without handles. Daughter states she would like to have 3in1 ordered.  Encouraged pt to don own shoes today and with encouragement pt did agree to work on this. Much effort and time required but pt able to don with mod assist to help lift Les off the floor and help manage heel of shoe. Practiced repetition of sit  to stand from recliner. Mod cues for how to scoot forward to EOC, foot placement, hand placement.       Vision                     Perception     Praxis      Cognition   Behavior During Therapy: WFL for tasks assessed/performed Overall Cognitive Status: Within Functional Limits for tasks assessed                       Extremity/Trunk Assessment               Exercises     Shoulder Instructions       General Comments      Pertinent Vitals/ Pain       Pain Assessment: Faces Faces Pain Scale: Hurts little more Pain Location: back Pain Descriptors / Indicators: Sore Pain Intervention(s): Repositioned  Home Living                                           Prior Functioning/Environment              Frequency Min 2X/week     Progress Toward Goals  OT Goals(current goals can now be found in the care plan section)  Progress towards OT goals: Progressing toward goals     Plan Discharge plan remains appropriate    Co-evaluation                 End of Session Equipment Utilized During Treatment: Rolling walker   Activity Tolerance Patient tolerated treatment well   Patient Left in chair;with call bell/phone within reach;with family/visitor present   Nurse Communication          Time: 7858-8502 OT Time Calculation (min): 33 min  Charges: OT General Charges $OT Visit: 1 Procedure OT Treatments $Self Care/Home Management : 8-22 mins $Therapeutic Activity: 8-22 mins  Jules Schick  [phone] 09/03/2014, 12:23 PM

## 2014-09-03 NOTE — Progress Notes (Signed)
Physical Therapy Treatment Patient Details Name: [name] MRN: [id number] DOB: 01/11/1920 Today's Date: 09/03/2014    History of Present Illness 81 year old African-American female with a past medical history of hypertension, pancreatic insufficiency, with a known ileal conduit who was recently treated for herpes zoster and presented with complaints of progressive weakness, decreased appetite and confusion. She was noted to have a urinary tract infection     PT Comments    Pt OOB in recliner with daughter in room.  Assisted with amb 2 short distances with + 2 assist for safety such that recliner was following.    Follow Up Recommendations  Home health PT;Supervision/Assistance - 24 hour (daughter provides 24/7 care)     Equipment Recommendations       Recommendations for Other Services       Precautions / Restrictions Precautions Precautions: Fall Precaution Comments: urostomy and ileostomy Restrictions Weight Bearing Restrictions: No    Mobility  Bed Mobility               General bed mobility comments: pt OOB with nursing.  Transfers Overall transfer level: Needs assistance Equipment used: Rolling walker (2 wheeled) Transfers: Sit to/from Stand Sit to Stand: +2 safety/equipment;+2 physical assistance;Min assist;Mod assist         General transfer comment: 50% VC's on proper hand placement esp with stand to sit  Ambulation/Gait Ambulation/Gait assistance: +2 safety/equipment;Min assist;Mod assist Ambulation Distance (Feet): 70 Feet (35 feet x 2) Assistive device: Rolling walker (2 wheeled) Gait Pattern/deviations: Step-to pattern;Decreased step length - right;Decreased step length - left;Shuffle;Narrow base of support;Trunk flexed     General Gait Details: cues for posture and position from [employer]            Wheelchair Mobility    Modified Rankin (Stroke Patients Only)       Balance                                     Cognition Arousal/Alertness: Awake/alert Behavior During Therapy: WFL for tasks assessed/performed Overall Cognitive Status: Within Functional Limits for tasks assessed                      Exercises      General Comments        Pertinent Vitals/Pain Pain Assessment: Faces Faces Pain Scale: Hurts a little bit Pain Location: back "shingles" Pain Descriptors / Indicators: Sore Pain Intervention(s): Repositioned;Monitored during session    Home Living                      Prior Function            PT Goals (current goals can now be found in the care plan section) Progress towards PT goals: Progressing toward goals    Frequency  Min 3X/week    PT Plan      Co-evaluation             End of Session Equipment Utilized During Treatment: Gait belt Activity Tolerance: Patient tolerated treatment well Patient left: in chair;with call bell/phone within reach;with family/visitor present     Time: 1202-1227 PT Time Calculation (min) (ACUTE ONLY): 25 min  Charges:  $Gait Training: 8-22 mins $Therapeutic Activity: 8-22 mins                    G Codes:  Rica Koyanagi  PTA [facility]  Acute  Rehab Pager      [phone]

## 2014-09-03 NOTE — Care Management Important Message (Signed)
Important Message  Patient Details  Name: ANALINA FILLA MRN: [id number] Date of Birth: Mar 29, 1919   Medicare Important Message Given:  Yes-second notification given    Shelda Altes 09/03/2014, 3:46 [place] Message  Patient Details  Name: AZORA BONZO MRN: [id number] Date of Birth: Jul 27, 1919   Medicare Important Message Given:  Yes-second notification given    Shelda Altes 09/03/2014, 3:46 PM

## 2014-09-03 NOTE — Progress Notes (Signed)
ANTIBIOTIC CONSULT NOTE  Pharmacy Consult for Cipro Indication: UTI  No Known Allergies  Patient Measurements: Height: 5\' 2"  (157.5 cm) Weight: 114 lb 10.2 oz (52 kg) IBW/kg (Calculated) : 50.1  Vital Signs: Temp: 98 F (36.7 C) (08/15 0613) Temp Source: Oral (08/15 5170) BP: 182/53 mmHg (08/15 0613) Pulse Rate: 66 (08/15 0613) Intake/Output from previous day: 08/14 0701 - 08/15 0700 In: 600 [P.O.:600] Out: 1975 [Stool:1975] Intake/Output from this shift:    Labs:  Recent Labs  09/01/14 0430 09/02/14 0511 09/03/14 0440  WBC 5.0 5.3 4.7  HGB 8.1* 8.6* 8.2*  PLT 179 192 189  CREATININE 2.04* 1.93* 2.05*   Estimated Creatinine Clearance: 13.6 mL/min (by C-G formula based on Cr of 2.05). No results for input(s): VANCOTROUGH, VANCOPEAK, VANCORANDOM, GENTTROUGH, GENTPEAK, GENTRANDOM, TOBRATROUGH, TOBRAPEAK, TOBRARND, AMIKACINPEAK, AMIKACINTROU, AMIKACIN in the last 72 hours.   Microbiology: Recent Results (from the past 720 hour(s))  Urine culture     Status: None   Collection Time: 08/30/14 10:40 PM  Result Value Ref Range Status   Specimen Description URINE, RANDOM  Final   Special Requests Normal  Final   Culture   Final    >=100,000 COLONIES/mL KLEBSIELLA PNEUMONIAE >=100,000 COLONIES/mL PROVIDENCIA RETTGERI Performed at [facility]    Report Status 09/03/2014 FINAL  Final   Organism ID, Bacteria KLEBSIELLA PNEUMONIAE  Final   Organism ID, Bacteria PROVIDENCIA RETTGERI  Final      Susceptibility   Klebsiella pneumoniae - MIC*    AMPICILLIN 16 RESISTANT Resistant     CEFAZOLIN <=4 SENSITIVE Sensitive     CEFTRIAXONE <=1 SENSITIVE Sensitive     CIPROFLOXACIN <=0.25 SENSITIVE Sensitive     GENTAMICIN <=1 SENSITIVE Sensitive     IMIPENEM <=0.25 SENSITIVE Sensitive     NITROFURANTOIN 64 INTERMEDIATE Intermediate     TRIMETH/SULFA <=20 SENSITIVE Sensitive     AMPICILLIN/SULBACTAM 4 SENSITIVE Sensitive     PIP/TAZO <=4 SENSITIVE Sensitive     *  >=100,000 COLONIES/mL KLEBSIELLA PNEUMONIAE   Providencia rettgeri - MIC*    AMPICILLIN <=2 RESISTANT Resistant     CEFAZOLIN >=64 RESISTANT Resistant     CEFTRIAXONE <=1 SENSITIVE Sensitive     CIPROFLOXACIN <=0.25 SENSITIVE Sensitive     GENTAMICIN <=1 SENSITIVE Sensitive     IMIPENEM 2 SENSITIVE Sensitive     NITROFURANTOIN <=16 RESISTANT Resistant     TRIMETH/SULFA <=20 SENSITIVE Sensitive     AMPICILLIN/SULBACTAM <=2 SENSITIVE Sensitive     PIP/TAZO <=4 SENSITIVE Sensitive     * >=100,000 COLONIES/mL PROVIDENCIA RETTGERI    Anti-infectives    Start     Dose/Rate Route Frequency Ordered Stop   09/03/14 1200  ciprofloxacin (CIPRO) tablet 500 mg     500 mg Oral Daily with breakfast 09/03/14 1149     08/31/14 2200  cefTRIAXone (ROCEPHIN) 1 g in dextrose 5 % 50 mL IVPB  Status:  Discontinued     1 g 100 mL/hr over 30 Minutes Intravenous Daily at bedtime 08/31/14 0125 09/03/14 1135   08/30/14 2245  cefTRIAXone (ROCEPHIN) 1 g in dextrose 5 % 50 mL IVPB     1 g 100 mL/hr over 30 Minutes Intravenous  Once 08/30/14 2238 08/31/14 0049      Assessment: 81 yo F admitted with UTI.  Cx data finalized (R Ancef).  Patient clinically improving.  MD requests change IV Rocephin to po Cipro for discharge planning.   Chronic renal failure noted.  Estimated CrCl < 15ml/min at patient's baseline.  8/11>>Rocephin>>8/15 8/15>>Cipro>>  Goal of Therapy:  Eradicate infection.  Plan:  Cipro 500mg  po q24h Monitor renal function and cx data  Duration of therapy per MD- recommend minimize duration of antibiotics in elderly lady with CRF to minimize risk for adverse effects  Noni Stonesifer, Lavonia Drafts 09/03/2014,12:58 PM

## 2014-09-04 DIAGNOSIS — N390 Urinary tract infection, site not specified: Secondary | ICD-10-CM | POA: Diagnosis not present

## 2014-09-04 LAB — BASIC METABOLIC PANEL
ANION GAP: 5 (ref 5–15)
BUN: 38 mg/dL — AB (ref 6–20)
CHLORIDE: 104 mmol/L (ref 101–111)
CO2: 22 mmol/L (ref 22–32)
Calcium: 7.9 mg/dL — ABNORMAL LOW (ref 8.9–10.3)
Creatinine, Ser: 1.96 mg/dL — ABNORMAL HIGH (ref 0.44–1.00)
GFR calc Af Amer: 24 mL/min — ABNORMAL LOW (ref >60)
GFR, EST NON AFRICAN AMERICAN: 21 mL/min — AB (ref >60)
GLUCOSE: 86 mg/dL (ref 65–99)
POTASSIUM: 4.3 mmol/L (ref 3.5–5.1)
Sodium: 131 mmol/L — ABNORMAL LOW (ref 135–145)

## 2014-09-04 LAB — CBC
HEMATOCRIT: 21.2 % — AB (ref 36.0–46.0)
HEMOGLOBIN: 7.2 g/dL — AB (ref 12.0–15.0)
MCH: 28.3 pg (ref 26.0–34.0)
MCHC: 34 g/dL (ref 30.0–36.0)
MCV: 83.5 fL (ref 78.0–100.0)
Platelets: 166 10*3/uL (ref 150–400)
RBC: 2.54 MIL/uL — ABNORMAL LOW (ref 3.87–5.11)
RDW: 14.4 % (ref 11.5–15.5)
WBC: 4.6 10*3/uL (ref 4.0–10.5)

## 2014-09-04 MED ORDER — CIPROFLOXACIN HCL 500 MG PO TABS
500.0000 mg | ORAL_TABLET | Freq: Every day | ORAL | Status: DC
Start: 2014-09-04 — End: 2015-01-25

## 2014-09-04 MED ORDER — PNEUMOCOCCAL VAC POLYVALENT 25 MCG/0.5ML IJ INJ
0.5000 mL | INJECTION | INTRAMUSCULAR | Status: AC
  Administered 2014-09-04: 0.5 mL via INTRAMUSCULAR
  Filled 2014-09-04: qty 0.5

## 2014-09-04 MED ORDER — CLONIDINE HCL 0.1 MG PO TABS: 0.1000 mg | ORAL_TABLET | Freq: Three times a day (TID) | ORAL | Status: AC

## 2014-09-04 NOTE — Discharge Instructions (Signed)
Anemia, Nonspecific Anemia is a condition in which the concentration of red blood cells or hemoglobin in the blood is below normal. Hemoglobin is a substance in red blood cells that carries oxygen to the tissues of the body. Anemia results in not enough oxygen reaching these tissues.  CAUSES  Common causes of anemia include:   Excessive bleeding. Bleeding may be internal or external. This includes excessive bleeding from periods (in women) or from the intestine.   Poor nutrition.   Chronic kidney, thyroid, and liver disease.  Bone marrow disorders that decrease red blood cell production.  Cancer and treatments for cancer.  HIV, AIDS, and their treatments.  Spleen problems that increase red blood cell destruction.  Blood disorders.  Excess destruction of red blood cells due to infection, medicines, and autoimmune disorders. SIGNS AND SYMPTOMS   Minor weakness.   Dizziness.   Headache.  Palpitations.   Shortness of breath, especially with exercise.   Paleness.  Cold sensitivity.  Indigestion.  Nausea.  Difficulty sleeping.  Difficulty concentrating. Symptoms may occur suddenly or they may develop slowly.  DIAGNOSIS  Additional blood tests are often needed. These help your health care provider determine the best treatment. Your health care provider will check your stool for blood and look for other causes of blood loss.  TREATMENT  Treatment varies depending on the cause of the anemia. Treatment can include:   Supplements of iron, vitamin I45, or folic acid.   Hormone medicines.   A blood transfusion. This may be needed if blood loss is severe.   Hospitalization. This may be needed if there is significant continual blood loss.   Dietary changes.  Spleen removal. HOME CARE INSTRUCTIONS Keep all follow-up appointments. It often takes many weeks to correct anemia, and having your health care provider check on your condition and your response to  treatment is very important. SEEK IMMEDIATE MEDICAL CARE IF:   You develop extreme weakness, shortness of breath, or chest pain.   You become dizzy or have trouble concentrating.  You develop heavy vaginal bleeding.   You develop a rash.   You have bloody or black, tarry stools.   You faint.   You vomit up blood.   You vomit repeatedly.   You have abdominal pain.  You have a fever or persistent symptoms for more than 2-3 days.   You have a fever and your symptoms suddenly get worse.   You are dehydrated.  MAKE SURE YOU:  Understand these instructions.  Will watch your condition.  Will get help right away if you are not doing well or get worse. Document Released: 02/13/2004 Document Revised: 09/07/2012 Document Reviewed: 07/01/2012 [facility] Patient Information 2015 [place], [place]. This information is not intended to replace advice given to you by your health care provider. Make sure you discuss any questions you have with your health care provider.   Urinary Tract Infection Urinary tract infections (UTIs) can develop anywhere along your urinary tract. Your urinary tract is your body's drainage system for removing wastes and extra water. Your urinary tract includes two kidneys, two ureters, a bladder, and a urethra. Your kidneys are a pair of bean-shaped organs. Each kidney is about the size of your fist. They are located below your ribs, one on each side of your spine. CAUSES Infections are caused by microbes, which are microscopic organisms, including fungi, viruses, and bacteria. These organisms are so small that they can only be seen through a microscope. Bacteria are the microbes that most commonly  cause UTIs. SYMPTOMS  Symptoms of UTIs may vary by age and gender of the patient and by the location of the infection. Symptoms in young women typically include a frequent and intense urge to urinate and a painful, burning feeling in the bladder or urethra during  urination. Older women and men are more likely to be tired, shaky, and weak and have muscle aches and abdominal pain. A fever may mean the infection is in your kidneys. Other symptoms of a kidney infection include pain in your back or sides below the ribs, nausea, and vomiting. DIAGNOSIS To diagnose a UTI, your caregiver will ask you about your symptoms. Your caregiver also will ask to provide a urine sample. The urine sample will be tested for bacteria and white blood cells. White blood cells are made by your body to help fight infection. TREATMENT  Typically, UTIs can be treated with medication. Because most UTIs are caused by a bacterial infection, they usually can be treated with the use of antibiotics. The choice of antibiotic and length of treatment depend on your symptoms and the type of bacteria causing your infection. HOME CARE INSTRUCTIONS  If you were prescribed antibiotics, take them exactly as your caregiver instructs you. Finish the medication even if you feel better after you have only taken some of the medication.  Drink enough water and fluids to keep your urine clear or pale yellow.  Avoid caffeine, tea, and carbonated beverages. They tend to irritate your bladder.  Empty your bladder often. Avoid holding urine for long periods of time.  Empty your bladder before and after sexual intercourse.  After a bowel movement, women should cleanse from front to back. Use each tissue only once. SEEK MEDICAL CARE IF:   You have back pain.  You develop a fever.  Your symptoms do not begin to resolve within 3 days. SEEK IMMEDIATE MEDICAL CARE IF:   You have severe back pain or lower abdominal pain.  You develop chills.  You have nausea or vomiting.  You have continued burning or discomfort with urination. MAKE SURE YOU:   Understand these instructions.  Will watch your condition.  Will get help right away if you are not doing well or get worse. Document Released:  10/15/2004 Document Revised: 07/07/2011 Document Reviewed: 02/13/2011 [facility] Patient Information 2015 [place], [place]. This information is not intended to replace advice given to you by your health care provider. Make sure you discuss any questions you have with your health care provider.

## 2014-09-04 NOTE — Care Management Note (Signed)
Case Management Note  Patient Details  Name: [name] MRN: [id number] Date of Birth: February 26, 1919  Subjective/Objective: Spoke to dtr about d/c plans, [facility] agency. Dtr chose [facility] rep Kristen aware of [place] orders, [facility] dme rep Lecretia-aware of orders.                   Action/Plan:d/c plan home w/HHC, DME.   Expected Discharge Date:                  Expected Discharge Plan:  [place]  In-House Referral:     Discharge planning Services  CM Consult  Post Acute Care Choice:    Choice offered to:  Adult Children  DME Arranged:  3-N-1 DME Agency:     HH Arranged:  PT, OT HH Agency:  [place]  Status of Service:  Completed, signed off  Medicare Important Message Given:  Yes-second notification given Date Medicare IM Given:    Medicare IM give by:    Date Additional Medicare IM Given:    Additional Medicare Important Message give by:     If discussed at [place] of Stay Meetings, dates discussed:    Additional Comments:  Dessa Phi, RN 09/04/2014, 10:17 AM

## 2014-09-04 NOTE — Discharge Summary (Signed)
[facility]  Physician Discharge Summary   Patient ID: [name] MRN: [id number] DOB/AGE: [birth year] 81 y.o.  Admit date: 08/30/2014 Discharge date: 09/04/2014  PCP: [name], MD  DISCHARGE DIAGNOSES:  Principal Problem:   UTI (urinary tract infection) with pyuria Active Problems:   Hyponatremia   Hypertension   Chronic kidney disease, stage 4, severely decreased GFR   Urinary bladder cancer   S/P ileal conduit   Pancreatic insufficiency   Glaucoma   Generalized weakness   Acute on chronic renal failure   RECOMMENDATIONS FOR OUTPATIENT FOLLOW UP: 1. Patient to follow-up with her primary care physician for further management of hypertension and anemia 2. Home health has been ordered   DISCHARGE CONDITION: fair  Diet recommendation: As before  [facility] Weights   08/30/14 1905 08/31/14 0135  Weight: 42.185 kg (93 lb) 52 kg (114 lb 10.2 oz)    INITIAL HISTORY: 81 year old African-American female with a past medical history of hypertension, pancreatic insufficiency, with a known ileal conduit who was recently treated for herpes zoster and presented with complaints of progressive weakness, decreased appetite and confusion. She was noted to have a urinary tract infection and was hospitalized.   HOSPITAL COURSE:   Urinary tract infection Urine cultures positive for Klebsiella and providencia. Sensitivities noted. Antibiotics were changed over to ciprofloxacin. Patient has tolerated well.  Generalized weakness/fatigue Likely due to recent herpes zoster and UTI. Physical and occupational therapy has evaluated. Home health has been recommended. Patient does not have any focal deficits. She is better.  Renal failure, likely acute on chronic Creatinine back in 2014 was 1.56. Noted to be around 1.9 during this hospitalization. This could be her new baseline. She was given gentle IV hydration. She had good urine output. Creatinine remains stable.    Hyponatremia and hypokalemia Electrolytes were repleted. Potassium was normal. Sodium level noted to remain low. Old labs reviewed. She appears to have chronic hyponatremia.   Mildly elevated troponin Patient denies any chest pain. EKG does not show any ischemic changes. Likely demand ischemia. Considering her advanced age, would not be too aggressive at this time.   History of essential hypertension Blood pressures still into the 180s at times. Considering her advanced age, we will not be too aggressive with blood pressure management. But there could be some benefit in increasing the dose of her clonidine, which we haven done. Further management as outpatient.   History of pancreatic insufficiency Continue pancreatic enzymes.  History of glaucoma Continue eyedrops.  Normocytic anemia Hemoglobin has decreased some more. No overt bleeding. Patient denies any black stools. She has had a long-standing history of anemia. She is not symptomatic. No clear indication for transfusion. There is some element of hemodilution as well. She and her daughter have been told that she needs to seek attention if she has any evidence for bleeding or black stools. Otherwise she can follow-up with her primary care physician next week. Continue with iron supplementation.  Recent herpes zoster Lesions are healing.  Overall improved. Okay for discharge today.  PERTINENT LABS:  The results of significant diagnostics from this hospitalization (including imaging, microbiology, ancillary and laboratory) are listed below for reference.    Microbiology: Recent Results (from the past 240 hour(s))  Urine culture     Status: None   Collection Time: 08/30/14 10:40 PM  Result Value Ref Range Status   Specimen Description URINE, RANDOM  Final   Special Requests Normal  Final   Culture   Final    >=  100,000 COLONIES/mL KLEBSIELLA PNEUMONIAE >=100,000 COLONIES/mL PROVIDENCIA RETTGERI Performed at [facility]    Report Status 09/03/2014 FINAL  Final   Organism ID, Bacteria KLEBSIELLA PNEUMONIAE  Final   Organism ID, Bacteria PROVIDENCIA RETTGERI  Final      Susceptibility   Klebsiella pneumoniae - MIC*    AMPICILLIN 16 RESISTANT Resistant     CEFAZOLIN <=4 SENSITIVE Sensitive     CEFTRIAXONE <=1 SENSITIVE Sensitive     CIPROFLOXACIN <=0.25 SENSITIVE Sensitive     GENTAMICIN <=1 SENSITIVE Sensitive     IMIPENEM <=0.25 SENSITIVE Sensitive     NITROFURANTOIN 64 INTERMEDIATE Intermediate     TRIMETH/SULFA <=20 SENSITIVE Sensitive     AMPICILLIN/SULBACTAM 4 SENSITIVE Sensitive     PIP/TAZO <=4 SENSITIVE Sensitive     * >=100,000 COLONIES/mL KLEBSIELLA PNEUMONIAE   Providencia rettgeri - MIC*    AMPICILLIN <=2 RESISTANT Resistant     CEFAZOLIN >=64 RESISTANT Resistant     CEFTRIAXONE <=1 SENSITIVE Sensitive     CIPROFLOXACIN <=0.25 SENSITIVE Sensitive     GENTAMICIN <=1 SENSITIVE Sensitive     IMIPENEM 2 SENSITIVE Sensitive     NITROFURANTOIN <=16 RESISTANT Resistant     TRIMETH/SULFA <=20 SENSITIVE Sensitive     AMPICILLIN/SULBACTAM <=2 SENSITIVE Sensitive     PIP/TAZO <=4 SENSITIVE Sensitive     * >=100,000 COLONIES/mL PROVIDENCIA RETTGERI     Labs: Basic Metabolic Panel:  Recent Labs Lab 08/31/14 0205 09/01/14 0430 09/02/14 0511 09/03/14 0440 09/04/14 0500  NA 130* 130* 128* 129* 131*  K 3.2* 4.0 4.0 3.9 4.3  CL 100* 103 100* 100* 104  CO2 22 23 22 22 22   GLUCOSE 116* 94 95 140* 86  BUN 35* 35* 34* 33* 38*  CREATININE 2.31* 2.04* 1.93* 2.05* 1.96*  CALCIUM 8.4* 8.0* 8.0* 8.2* 7.9*   Liver Function Tests:  Recent Labs Lab 08/30/14 1928 08/31/14 0205  AST 52* 53*  ALT 13* 16  ALKPHOS 33* 36*  BILITOT 0.4 0.4  PROT 6.0* 6.1*  ALBUMIN 3.2* 2.9*   CBC:  Recent Labs Lab 08/31/14 0205 09/01/14 0430 09/02/14 0511 09/03/14 0440 09/04/14 0500  WBC 4.9 5.0 5.3 4.7 4.6  NEUTROABS 2.7  --   --   --   --   HGB 9.9* 8.1* 8.6* 8.2* 7.2*  HCT 28.5* 23.6*  24.7* 24.6* 21.2*  MCV 82.6 83.1 83.2 83.7 83.5  PLT 203 179 192 189 166   Cardiac Enzymes:  Recent Labs Lab 08/30/14 2039 08/31/14 0205 08/31/14 0815 08/31/14 1450 09/01/14 0430  TROPONINI 0.17* 0.14* 0.15* 0.14* 0.09*   BNP: BNP (last 3 results)  Recent Labs  08/30/14 1928  BNP 395.6*   CBG:  Recent Labs Lab 08/30/14 1942  GLUCAP 94     IMAGING STUDIES Dg Chest 2 View  08/30/2014   CLINICAL DATA:  Pain following fall  EXAM: CHEST  2 VIEW  COMPARISON:  October 01, 2012  FINDINGS: There is atelectasis in the left base. The lungs elsewhere clear. Heart size and pulmonary vascularity are normal. No adenopathy. There is lower thoracic levoscoliosis. No pneumothorax.  IMPRESSION: Left base atelectasis. No edema or consolidation. Marked thoracolumbar levoscoliosis.   Electronically Signed   By: Lowella Grip III M.D.   On: 08/30/2014 20:59   Dg Pelvis 1-2 Views  08/30/2014   CLINICAL DATA:  Daughter states pt slipped out of wheelchair yesterday. Pt complained of some bilateral knee swelling. Daughter states pt has been really week. NO chest complaints. Hx  of HTN.  EXAM: PELVIS - 1-2 VIEW  COMPARISON:  CT, 10/01/2012  FINDINGS: No fracture. Hip joints, SI joints and symphysis pubis are normally aligned. Bones are diffusely demineralized.  There are numerous surgical vascular calcifications and bowel anastomosis staples in the low abdomen and pelvis. Dense femoral and iliac artery vascular calcifications are noted.  IMPRESSION: 1. No fracture or dislocation.  No acute finding.   Electronically Signed   By: Lajean Manes M.D.   On: 08/30/2014 20:58   Dg Knee 2 Views Left  08/30/2014   CLINICAL DATA:  Daughter states pt slipped out of wheelchair yesterday. Pt complained of some bilateral knee swelling. Daughter states pt has been really week. NO chest complaints. Hx of HTN.  EXAM: LEFT KNEE - 1-2 VIEW  COMPARISON:  None.  FINDINGS: No fracture. Knee joint is normally aligned.  There is medial joint space compartment narrowing. Bones are demineralized. No bone lesion.  No joint effusion. There are dense vascular calcifications posteriorly.  IMPRESSION: No fracture or dislocation.   Electronically Signed   By: Lajean Manes M.D.   On: 08/30/2014 20:56   Dg Knee 2 Views Right  08/30/2014   CLINICAL DATA:  Daughter states pt slipped out of wheelchair yesterday. Pt complained of some bilateral knee swelling. Daughter states pt has been really week. NO chest complaints. Hx of HTN.  EXAM: RIGHT KNEE - 1-2 VIEW  COMPARISON:  None.  FINDINGS: No fracture. No bone lesion. Knee joint is normally spaced and aligned. No joint effusion. Bones are demineralized. There are dense vascular calcifications posteriorly.  IMPRESSION: No fracture or dislocation.   Electronically Signed   By: Lajean Manes M.D.   On: 08/30/2014 20:57    DISCHARGE EXAMINATION: Filed Vitals:   09/03/14 1445 09/03/14 2045 09/04/14 0504 09/04/14 0857  BP: 159/56 164/47 156/48 186/60  Pulse: 78 71 63   Temp: 98.3 F (36.8 C) 98.4 F (36.9 C) 98.5 F (36.9 C)   TempSrc: Oral Oral Oral   Resp: 17 16 19    Height:      Weight:      SpO2: 100% 98% 100%    General appearance: alert, cooperative, appears stated age and no distress Resp: clear to auscultation bilaterally Cardio: regular rate and rhythm, S1, S2 normal, no murmur, click, rub or gallop GI: soft, non-tender; bowel sounds normal; no masses,  no organomegaly  DISPOSITION: Home  Discharge Instructions    Call MD for:  difficulty breathing, headache or visual disturbances    Complete by:  As directed      Call MD for:  extreme fatigue    Complete by:  As directed      Call MD for:  persistant dizziness or light-headedness    Complete by:  As directed      Call MD for:  persistant nausea and vomiting    Complete by:  As directed      Call MD for:  severe uncontrolled pain    Complete by:  As directed      Diet - low sodium heart healthy     Complete by:  As directed      Discharge instructions    Complete by:  As directed   Please follow up with Dr. Osborne Casco within 1 week. You will need blood work for anemia. Please seek attention earlier if you notice bleeding in your urine or stool. Or if you see black colored stool.  You were cared for by a hospitalist during your  hospital stay. If you have any questions about your discharge medications or the care you received while you were in the hospital after you are discharged, you can call the unit and asked to speak with the hospitalist on call if the hospitalist that took care of you is not available. Once you are discharged, your primary care physician will handle any further medical issues. Please note that NO REFILLS for any discharge medications will be authorized once you are discharged, as it is imperative that you return to your primary care physician (or establish a relationship with a primary care physician if you do not have one) for your aftercare needs so that they can reassess your need for medications and monitor your lab values. If you do not have a primary care physician, you can call [phone] for a physician referral.     Increase activity slowly    Complete by:  As directed            ALLERGIES: No Known Allergies   Discharge Medication List as of 09/04/2014 11:47 AM    START taking these medications   Details  ciprofloxacin (CIPRO) 500 MG tablet Take 1 tablet (500 mg total) by mouth daily with breakfast. For 5 more days., Starting 09/04/2014, Until Discontinued, Normal      CONTINUE these medications which have CHANGED   Details  cloNIDine (CATAPRES) 0.1 MG tablet Take 1 tablet (0.1 mg total) by mouth 3 (three) times daily., Starting 09/04/2014, Until Discontinued, Normal      CONTINUE these medications which have NOT CHANGED   Details  alendronate (FOSAMAX) 70 MG tablet Take 70 mg by mouth every 7 (seven) days. On Mondays, Starting 08/30/2012, Until Discontinued,  Historical Med    amLODipine (NORVASC) 10 MG tablet Take 1 tablet (10 mg total) by mouth daily., Starting 10/04/2012, Until Discontinued, No Print    cholecalciferol (VITAMIN D) 1000 UNITS tablet Take 5,000 Units by mouth daily., Until Discontinued, Historical Med    docusate sodium 100 MG CAPS Take 100 mg by mouth 2 (two) times daily., Starting 10/04/2012, Until Discontinued, OTC    ferrous fumarate (HEMOCYTE - 106 MG FE) 325 (106 FE) MG TABS tablet Take 1 tablet by mouth 2 (two) times daily., Until Discontinued, Historical Med    furosemide (LASIX) 20 MG tablet Take 20 mg by mouth daily., Until Discontinued, Historical Med    !! lipase/protease/amylase (CREON) 36000 UNITS CPEP capsule Take 36,000 Units by mouth 3 (three) times daily before meals., Until Discontinued, Historical Med    Olopatadine HCl (PATADAY) 0.2 % SOLN Apply 1 drop to eye at bedtime., Until Discontinued, Historical Med    Polyvinyl Alcohol-Povidone (REFRESH OP) Apply 1 drop to eye daily as needed (dry eyes)., Until Discontinued, Historical Med    !! lipase/protease/amylase (CREON-12/PANCREASE) 12000 UNITS CPEP capsule Take 2 capsules by mouth 3 (three) times daily before meals., Starting 10/04/2012, Until Discontinued, No Print     !! - Potential duplicate medications found. Please discuss with provider.     Follow-up Information    Follow up with [name], MD. Schedule an appointment as soon as possible for a visit in 1 week.   Specialty:  Internal Medicine   Why:  post hospitalization follow up and for blood work for anemia   Contact information:   [place] [address] [phone]       Follow up with [facility].   Why:  HHPT/HHOT   Contact information:   [place]  [place] [address] [phone]       Follow up with [facility].   Why:  3n1   Contact information:   [address] [place] [address] [phone]        TOTAL DISCHARGE TIME: 35 minutes  [facility] Pager [phone]  09/04/2014, 4:47 PM

## 2014-12-19 ENCOUNTER — Other Ambulatory Visit: Payer: Self-pay | Admitting: Nephrology

## 2014-12-19 DIAGNOSIS — N185 Chronic kidney disease, stage 5: Secondary | ICD-10-CM

## 2014-12-26 ENCOUNTER — Other Ambulatory Visit: Payer: Commercial Managed Care - HMO

## 2014-12-26 NOTE — Discharge Instructions (Signed)
Epoetin Alfa injection What is this medicine? EPOETIN ALFA (e POE e tin AL fa) helps your body make more red blood cells. This medicine is used to treat anemia caused by chronic kidney failure, cancer chemotherapy, or HIV-therapy. It may also be used before surgery if you have anemia. This medicine may be used for other purposes; ask your health care provider or pharmacist if you have questions. What should I tell my health care provider before I take this medicine? They need to know if you have any of these conditions: -blood clotting disorders -cancer patient not on chemotherapy -cystic fibrosis -heart disease, such as angina or heart failure -hemoglobin level of 12 g/dL or greater -high blood pressure -low levels of folate, iron, or vitamin B12 -seizures -an unusual or allergic reaction to erythropoietin, albumin, benzyl alcohol, hamster proteins, other medicines, foods, dyes, or preservatives -pregnant or trying to get pregnant -breast-feeding How should I use this medicine? This medicine is for injection into a vein or under the skin. It is usually given by a health care professional in a hospital or clinic setting. If you get this medicine at home, you will be taught how to prepare and give this medicine. Use exactly as directed. Take your medicine at regular intervals. Do not take your medicine more often than directed. It is important that you put your used needles and syringes in a special sharps container. Do not put them in a trash can. If you do not have a sharps container, call your pharmacist or healthcare provider to get one. Talk to your pediatrician regarding the use of this medicine in children. While this drug may be prescribed for selected conditions, precautions do apply. Overdosage: If you think you have taken too much of this medicine contact a poison control center or emergency room at once. NOTE: This medicine is only for you. Do not share this medicine with  others. What if I miss a dose? If you miss a dose, take it as soon as you can. If it is almost time for your next dose, take only that dose. Do not take double or extra doses. What may interact with this medicine? Do not take this medicine with any of the following medications: -darbepoetin alfa This list may not describe all possible interactions. Give your health care provider a list of all the medicines, herbs, non-prescription drugs, or dietary supplements you use. Also tell them if you smoke, drink alcohol, or use illegal drugs. Some items may interact with your medicine. What should I watch for while using this medicine? Visit your prescriber or health care professional for regular checks on your progress and for the needed blood tests and blood pressure measurements. It is especially important for the doctor to make sure your hemoglobin level is in the desired range, to limit the risk of potential side effects and to give you the best benefit. Keep all appointments for any recommended tests. Check your blood pressure as directed. Ask your doctor what your blood pressure should be and when you should contact him or her. As your body makes more red blood cells, you may need to take iron, folic acid, or vitamin B supplements. Ask your doctor or health care provider which products are right for you. If you have kidney disease continue dietary restrictions, even though this medication can make you feel better. Talk with your doctor or health care professional about the foods you eat and the vitamins that you take. What side effects may I notice   from receiving this medicine? Side effects that you should report to your doctor or health care professional as soon as possible: -allergic reactions like skin rash, itching or hives, swelling of the face, lips, or tongue -breathing problems -changes in vision -chest pain -confusion, trouble speaking or understanding -feeling faint or lightheaded,  falls -high blood pressure -muscle aches or pains -pain, swelling, warmth in the leg -rapid weight gain -severe headaches -sudden numbness or weakness of the face, arm or leg -trouble walking, dizziness, loss of balance or coordination -seizures (convulsions) -swelling of the ankles, feet, hands -unusually weak or tired Side effects that usually do not require medical attention (report to your doctor or health care professional if they continue or are bothersome): -diarrhea -fever, chills (flu-like symptoms) -headaches -nausea, vomiting -redness, stinging, or swelling at site where injected This list may not describe all possible side effects. Call your doctor for medical advice about side effects. You may report side effects to FDA at 1-800-FDA-1088. Where should I keep my medicine? Keep out of the reach of children. Store in a refrigerator between 2 and 8 degrees C (36 and 46 degrees F). Do not freeze or shake. Throw away any unused portion if using a single-dose vial. Multi-dose vials can be kept in the refrigerator for up to 21 days after the initial dose. Throw away unused medicine. NOTE: This sheet is a summary. It may not cover all possible information. If you have questions about this medicine, talk to your doctor, pharmacist, or health care provider.    2016, [employer]vier/[employer] Standard. (2007-12-20 10:25:44)  

## 2014-12-27 ENCOUNTER — Ambulatory Visit
Admission: RE | Admit: 2014-12-27 | Discharge: 2014-12-27 | Disposition: A | Discharge status: Home / Self Care | Payer: Commercial Managed Care - HMO | Source: Ambulatory Visit | Attending: Nephrology | Admitting: Nephrology

## 2014-12-27 ENCOUNTER — Encounter (HOSPITAL_COMMUNITY)
Admission: RE | Admit: 2014-12-27 | Discharge: 2014-12-27 | Disposition: A | Discharge status: Home / Self Care | Payer: Commercial Managed Care - HMO | Source: Ambulatory Visit | Attending: Nephrology | Admitting: Nephrology

## 2014-12-27 DIAGNOSIS — N185 Chronic kidney disease, stage 5: Secondary | ICD-10-CM | POA: Diagnosis present

## 2014-12-27 DIAGNOSIS — D631 Anemia in chronic kidney disease: Secondary | ICD-10-CM | POA: Diagnosis not present

## 2014-12-27 DIAGNOSIS — Z79899 Other long term (current) drug therapy: Secondary | ICD-10-CM | POA: Insufficient documentation

## 2014-12-27 DIAGNOSIS — Z5181 Encounter for therapeutic drug level monitoring: Secondary | ICD-10-CM | POA: Diagnosis not present

## 2014-12-27 DIAGNOSIS — D509 Iron deficiency anemia, unspecified: Secondary | ICD-10-CM | POA: Insufficient documentation

## 2014-12-27 LAB — POCT HEMOGLOBIN-HEMACUE: HEMOGLOBIN: 8 g/dL — AB (ref 12.0–15.0)

## 2014-12-27 MED ORDER — EPOETIN ALFA 10000 UNIT/ML IJ SOLN
INTRAMUSCULAR | Status: AC
  Filled 2014-12-27: qty 1

## 2014-12-27 MED ORDER — EPOETIN ALFA 10000 UNIT/ML IJ SOLN
10000.0000 [IU] | INTRAMUSCULAR | Status: DC
  Administered 2014-12-27: 10000 [IU] via SUBCUTANEOUS

## 2015-01-03 ENCOUNTER — Encounter (HOSPITAL_COMMUNITY): Payer: Commercial Managed Care - HMO

## 2015-01-04 ENCOUNTER — Encounter (HOSPITAL_COMMUNITY)
Admission: RE | Admit: 2015-01-04 | Discharge: 2015-01-04 | Disposition: A | Discharge status: Home / Self Care | Payer: Commercial Managed Care - HMO | Source: Ambulatory Visit | Attending: Nephrology | Admitting: Nephrology

## 2015-01-04 DIAGNOSIS — N185 Chronic kidney disease, stage 5: Secondary | ICD-10-CM | POA: Diagnosis not present

## 2015-01-04 LAB — POCT HEMOGLOBIN-HEMACUE: HEMOGLOBIN: 7.7 g/dL — AB (ref 12.0–15.0)

## 2015-01-04 MED ORDER — EPOETIN ALFA 10000 UNIT/ML IJ SOLN
INTRAMUSCULAR | Status: AC
  Filled 2015-01-04: qty 1

## 2015-01-04 MED ORDER — EPOETIN ALFA 10000 UNIT/ML IJ SOLN
10000.0000 [IU] | INTRAMUSCULAR | Status: DC
  Administered 2015-01-04: 10000 [IU] via SUBCUTANEOUS

## 2015-01-04 NOTE — Progress Notes (Signed)
hemocue today 7.7.  Pt denied SOB, chest pain, or seeing any bleeding and stated she does not feel bad.  Reported results to Crystal at [facility] and also told her her last hemocue was 8.  No new orders received

## 2015-01-11 ENCOUNTER — Encounter (HOSPITAL_COMMUNITY)
Admission: RE | Admit: 2015-01-11 | Discharge: 2015-01-11 | Disposition: A | Discharge status: Home / Self Care | Payer: Commercial Managed Care - HMO | Source: Ambulatory Visit | Attending: Nephrology | Admitting: Nephrology

## 2015-01-11 DIAGNOSIS — N185 Chronic kidney disease, stage 5: Secondary | ICD-10-CM | POA: Diagnosis not present

## 2015-01-11 MED ORDER — EPOETIN ALFA 10000 UNIT/ML IJ SOLN
INTRAMUSCULAR | Status: AC
  Filled 2015-01-11: qty 1

## 2015-01-11 MED ORDER — EPOETIN ALFA 10000 UNIT/ML IJ SOLN
10000.0000 [IU] | INTRAMUSCULAR | Status: DC
  Administered 2015-01-11: 10000 [IU] via SUBCUTANEOUS

## 2015-01-15 LAB — POCT HEMOGLOBIN-HEMACUE: Hemoglobin: 8.6 g/dL — ABNORMAL LOW (ref 12.0–15.0)

## 2015-01-18 ENCOUNTER — Encounter (HOSPITAL_COMMUNITY)
Admission: RE | Admit: 2015-01-18 | Discharge: 2015-01-18 | Disposition: A | Discharge status: Home / Self Care | Payer: Commercial Managed Care - HMO | Source: Ambulatory Visit | Attending: Nephrology | Admitting: Nephrology

## 2015-01-18 DIAGNOSIS — N185 Chronic kidney disease, stage 5: Secondary | ICD-10-CM | POA: Diagnosis not present

## 2015-01-18 MED ORDER — EPOETIN ALFA 20000 UNIT/ML IJ SOLN
INTRAMUSCULAR | Status: AC
  Administered 2015-01-18: 12000 [IU]
  Filled 2015-01-18: qty 1

## 2015-01-18 MED ORDER — EPOETIN ALFA 10000 UNIT/ML IJ SOLN: 10000.0000 [IU] | INTRAMUSCULAR | Status: DC

## 2015-01-18 MED ORDER — SODIUM CHLORIDE 0.9 % IV SOLN
510.0000 mg | INTRAVENOUS | Status: DC
  Administered 2015-01-18: 510 mg via INTRAVENOUS
  Filled 2015-01-18: qty 17

## 2015-01-18 MED ORDER — EPOETIN ALFA 10000 UNIT/ML IJ SOLN: 12000.0000 [IU] | INTRAMUSCULAR | Status: DC

## 2015-01-18 NOTE — Progress Notes (Signed)
Called [facility] in reference to Hgb of 7.9.  Per Dr. Marval Regal, increase Procrit to 12,000 units and prescribed feraheme for patient.

## 2015-01-19 ENCOUNTER — Inpatient Hospital Stay (HOSPITAL_COMMUNITY)
Admission: EM | Admit: 2015-01-19 | Discharge: 2015-01-25 | DRG: 603 | Disposition: A | Discharge status: Hospice | Payer: Commercial Managed Care - HMO | Attending: Internal Medicine | Admitting: Internal Medicine

## 2015-01-19 ENCOUNTER — Emergency Department (HOSPITAL_COMMUNITY): Payer: Commercial Managed Care - HMO

## 2015-01-19 ENCOUNTER — Encounter (HOSPITAL_COMMUNITY): Payer: Self-pay | Admitting: Emergency Medicine

## 2015-01-19 DIAGNOSIS — B999 Unspecified infectious disease: Secondary | ICD-10-CM

## 2015-01-19 DIAGNOSIS — Z79899 Other long term (current) drug therapy: Secondary | ICD-10-CM

## 2015-01-19 DIAGNOSIS — Z906 Acquired absence of other parts of urinary tract: Secondary | ICD-10-CM

## 2015-01-19 DIAGNOSIS — Z792 Long term (current) use of antibiotics: Secondary | ICD-10-CM

## 2015-01-19 DIAGNOSIS — D631 Anemia in chronic kidney disease: Secondary | ICD-10-CM | POA: Diagnosis present

## 2015-01-19 DIAGNOSIS — C679 Malignant neoplasm of bladder, unspecified: Secondary | ICD-10-CM | POA: Diagnosis present

## 2015-01-19 DIAGNOSIS — N179 Acute kidney failure, unspecified: Secondary | ICD-10-CM | POA: Diagnosis present

## 2015-01-19 DIAGNOSIS — L89892 Pressure ulcer of other site, stage 2: Secondary | ICD-10-CM | POA: Diagnosis present

## 2015-01-19 DIAGNOSIS — R197 Diarrhea, unspecified: Secondary | ICD-10-CM | POA: Diagnosis not present

## 2015-01-19 DIAGNOSIS — Z66 Do not resuscitate: Secondary | ICD-10-CM | POA: Diagnosis present

## 2015-01-19 DIAGNOSIS — L8990 Pressure ulcer of unspecified site, unspecified stage: Secondary | ICD-10-CM | POA: Insufficient documentation

## 2015-01-19 DIAGNOSIS — L02416 Cutaneous abscess of left lower limb: Secondary | ICD-10-CM | POA: Diagnosis not present

## 2015-01-19 DIAGNOSIS — R0902 Hypoxemia: Secondary | ICD-10-CM | POA: Diagnosis present

## 2015-01-19 DIAGNOSIS — L03116 Cellulitis of left lower limb: Secondary | ICD-10-CM | POA: Diagnosis present

## 2015-01-19 DIAGNOSIS — T502X5A Adverse effect of carbonic-anhydrase inhibitors, benzothiadiazides and other diuretics, initial encounter: Secondary | ICD-10-CM | POA: Diagnosis present

## 2015-01-19 DIAGNOSIS — I129 Hypertensive chronic kidney disease with stage 1 through stage 4 chronic kidney disease, or unspecified chronic kidney disease: Secondary | ICD-10-CM | POA: Diagnosis present

## 2015-01-19 DIAGNOSIS — I878 Other specified disorders of veins: Secondary | ICD-10-CM | POA: Diagnosis present

## 2015-01-19 DIAGNOSIS — M7989 Other specified soft tissue disorders: Secondary | ICD-10-CM | POA: Diagnosis not present

## 2015-01-19 DIAGNOSIS — J811 Chronic pulmonary edema: Secondary | ICD-10-CM | POA: Diagnosis present

## 2015-01-19 DIAGNOSIS — Z515 Encounter for palliative care: Secondary | ICD-10-CM | POA: Diagnosis present

## 2015-01-19 DIAGNOSIS — E876 Hypokalemia: Secondary | ICD-10-CM | POA: Diagnosis present

## 2015-01-19 DIAGNOSIS — K5900 Constipation, unspecified: Secondary | ICD-10-CM | POA: Diagnosis present

## 2015-01-19 DIAGNOSIS — L89152 Pressure ulcer of sacral region, stage 2: Secondary | ICD-10-CM | POA: Diagnosis present

## 2015-01-19 DIAGNOSIS — H409 Unspecified glaucoma: Secondary | ICD-10-CM | POA: Diagnosis present

## 2015-01-19 DIAGNOSIS — I10 Essential (primary) hypertension: Secondary | ICD-10-CM | POA: Diagnosis not present

## 2015-01-19 DIAGNOSIS — N184 Chronic kidney disease, stage 4 (severe): Secondary | ICD-10-CM | POA: Diagnosis present

## 2015-01-19 DIAGNOSIS — K8689 Other specified diseases of pancreas: Secondary | ICD-10-CM | POA: Diagnosis present

## 2015-01-19 DIAGNOSIS — Z87440 Personal history of urinary (tract) infections: Secondary | ICD-10-CM

## 2015-01-19 DIAGNOSIS — D72829 Elevated white blood cell count, unspecified: Secondary | ICD-10-CM | POA: Diagnosis present

## 2015-01-19 DIAGNOSIS — J810 Acute pulmonary edema: Secondary | ICD-10-CM | POA: Diagnosis not present

## 2015-01-19 HISTORY — DX: Personal history of malignant neoplasm of bladder: Z85.51

## 2015-01-19 LAB — BRAIN NATRIURETIC PEPTIDE: B NATRIURETIC PEPTIDE 5: 235.8 pg/mL — AB (ref 0.0–100.0)

## 2015-01-19 LAB — CBC WITH DIFFERENTIAL/PLATELET
BASOS ABS: 0 10*3/uL (ref 0.0–0.1)
Basophils Relative: 0 %
EOS ABS: 0 10*3/uL (ref 0.0–0.7)
Eosinophils Relative: 0 %
HCT: 23.2 % — ABNORMAL LOW (ref 36.0–46.0)
Hemoglobin: 7.9 g/dL — ABNORMAL LOW (ref 12.0–15.0)
LYMPHS ABS: 1.1 10*3/uL (ref 0.7–4.0)
Lymphocytes Relative: 5 %
MCH: 29.5 pg (ref 26.0–34.0)
MCHC: 34.1 g/dL (ref 30.0–36.0)
MCV: 86.6 fL (ref 78.0–100.0)
MONO ABS: 1.7 10*3/uL — AB (ref 0.1–1.0)
MONOS PCT: 8 %
Neutro Abs: 18.9 10*3/uL — ABNORMAL HIGH (ref 1.7–7.7)
Neutrophils Relative %: 87 %
PLATELETS: 320 10*3/uL (ref 150–400)
RBC: 2.68 MIL/uL — AB (ref 3.87–5.11)
RDW: 14.8 % (ref 11.5–15.5)
WBC: 21.7 10*3/uL — AB (ref 4.0–10.5)

## 2015-01-19 LAB — PHOSPHORUS: PHOSPHORUS: 4.7 mg/dL — AB (ref 2.5–4.6)

## 2015-01-19 LAB — I-STAT CG4 LACTIC ACID, ED
LACTIC ACID, VENOUS: 1.11 mmol/L (ref 0.5–2.0)
LACTIC ACID, VENOUS: 1.08 mmol/L (ref 0.5–2.0)

## 2015-01-19 LAB — MAGNESIUM: MAGNESIUM: 1.3 mg/dL — AB (ref 1.7–2.4)

## 2015-01-19 LAB — COMPREHENSIVE METABOLIC PANEL
ALT: 30 U/L (ref 14–54)
ANION GAP: 16 — AB (ref 5–15)
AST: 31 U/L (ref 15–41)
Albumin: 2.5 g/dL — ABNORMAL LOW (ref 3.5–5.0)
Alkaline Phosphatase: 81 U/L (ref 38–126)
BILIRUBIN TOTAL: 0.8 mg/dL (ref 0.3–1.2)
BUN: 115 mg/dL — AB (ref 6–20)
CO2: 18 mmol/L — ABNORMAL LOW (ref 22–32)
Calcium: 8 mg/dL — ABNORMAL LOW (ref 8.9–10.3)
Chloride: 99 mmol/L — ABNORMAL LOW (ref 101–111)
Creatinine, Ser: 4.54 mg/dL — ABNORMAL HIGH (ref 0.44–1.00)
GFR, EST AFRICAN AMERICAN: 9 mL/min — AB (ref >60)
GFR, EST NON AFRICAN AMERICAN: 8 mL/min — AB (ref >60)
Glucose, Bld: 159 mg/dL — ABNORMAL HIGH (ref 65–99)
POTASSIUM: 3.5 mmol/L (ref 3.5–5.1)
Sodium: 133 mmol/L — ABNORMAL LOW (ref 135–145)
TOTAL PROTEIN: 6.6 g/dL (ref 6.5–8.1)

## 2015-01-19 MED ORDER — ONDANSETRON HCL 4 MG PO TABS: 4.0000 mg | ORAL_TABLET | Freq: Four times a day (QID) | ORAL | Status: DC | PRN

## 2015-01-19 MED ORDER — PANTOPRAZOLE SODIUM 40 MG PO TBEC
40.0000 mg | DELAYED_RELEASE_TABLET | Freq: Every day | ORAL | Status: DC
  Administered 2015-01-19 – 2015-01-25 (×7): 40 mg via ORAL
  Filled 2015-01-19 (×7): qty 1

## 2015-01-19 MED ORDER — ACETAMINOPHEN 650 MG RE SUPP: 650.0000 mg | Freq: Four times a day (QID) | RECTAL | Status: DC | PRN

## 2015-01-19 MED ORDER — ENOXAPARIN SODIUM 30 MG/0.3ML ~~LOC~~ SOLN
30.0000 mg | SUBCUTANEOUS | Status: DC
  Administered 2015-01-19 – 2015-01-24 (×6): 30 mg via SUBCUTANEOUS
  Filled 2015-01-19 (×6): qty 0.3

## 2015-01-19 MED ORDER — PIPERACILLIN-TAZOBACTAM IN DEX 2-0.25 GM/50ML IV SOLN
2.2500 g | Freq: Three times a day (TID) | INTRAVENOUS | Status: DC
  Administered 2015-01-19 – 2015-01-25 (×17): 2.25 g via INTRAVENOUS
  Filled 2015-01-19 (×20): qty 50

## 2015-01-19 MED ORDER — PANCRELIPASE (LIP-PROT-AMYL) 36000-114000 UNITS PO CPEP
36000.0000 [IU] | ORAL_CAPSULE | Freq: Three times a day (TID) | ORAL | Status: DC | PRN
  Filled 2015-01-19: qty 1

## 2015-01-19 MED ORDER — METOLAZONE 2.5 MG PO TABS
2.5000 mg | ORAL_TABLET | Freq: Every day | ORAL | Status: DC
  Filled 2015-01-19: qty 1

## 2015-01-19 MED ORDER — LABETALOL HCL 100 MG PO TABS
100.0000 mg | ORAL_TABLET | Freq: Two times a day (BID) | ORAL | Status: DC
  Administered 2015-01-19 – 2015-01-25 (×12): 100 mg via ORAL
  Filled 2015-01-19 (×13): qty 1

## 2015-01-19 MED ORDER — POLYETHYLENE GLYCOL 3350 17 G PO PACK
17.0000 g | PACK | Freq: Every day | ORAL | Status: DC
  Administered 2015-01-20 – 2015-01-21 (×2): 17 g via ORAL
  Filled 2015-01-19 (×3): qty 1

## 2015-01-19 MED ORDER — SODIUM CHLORIDE 0.9 % IV SOLN
250.0000 mL | INTRAVENOUS | Status: DC | PRN
  Administered 2015-01-23: 250 mL via INTRAVENOUS

## 2015-01-19 MED ORDER — FERROUS SULFATE 325 (65 FE) MG PO TABS
325.0000 mg | ORAL_TABLET | Freq: Two times a day (BID) | ORAL | Status: DC
  Administered 2015-01-20 – 2015-01-25 (×10): 325 mg via ORAL
  Filled 2015-01-19 (×11): qty 1

## 2015-01-19 MED ORDER — SODIUM CHLORIDE 0.9 % IJ SOLN
3.0000 mL | Freq: Two times a day (BID) | INTRAMUSCULAR | Status: DC
  Administered 2015-01-19 – 2015-01-25 (×4): 3 mL via INTRAVENOUS

## 2015-01-19 MED ORDER — CARBOXYMETHYLCELLULOSE SODIUM 1 % OP SOLN: 1.0000 [drp] | Freq: Two times a day (BID) | OPHTHALMIC | Status: DC | PRN

## 2015-01-19 MED ORDER — VANCOMYCIN HCL IN DEXTROSE 1-5 GM/200ML-% IV SOLN
1000.0000 mg | Freq: Once | INTRAVENOUS | Status: AC
  Administered 2015-01-19: 1000 mg via INTRAVENOUS
  Filled 2015-01-19: qty 200

## 2015-01-19 MED ORDER — ONDANSETRON HCL 4 MG/2ML IJ SOLN: 4.0000 mg | Freq: Four times a day (QID) | INTRAMUSCULAR | Status: DC | PRN

## 2015-01-19 MED ORDER — ONDANSETRON HCL 4 MG/2ML IJ SOLN: 4.0000 mg | Freq: Three times a day (TID) | INTRAMUSCULAR | Status: DC | PRN

## 2015-01-19 MED ORDER — SODIUM CHLORIDE 0.9 % IV SOLN
510.0000 mg | INTRAVENOUS | Status: AC
  Administered 2015-01-25: 510 mg via INTRAVENOUS
  Filled 2015-01-19: qty 17

## 2015-01-19 MED ORDER — VITAMIN D 1000 UNITS PO TABS
5000.0000 [IU] | ORAL_TABLET | Freq: Every day | ORAL | Status: DC
  Administered 2015-01-20 – 2015-01-25 (×6): 5000 [IU] via ORAL
  Filled 2015-01-19 (×7): qty 5

## 2015-01-19 MED ORDER — SODIUM CHLORIDE 0.9 % IV SOLN
INTRAVENOUS | Status: DC
  Administered 2015-01-19: 23:00:00 via INTRAVENOUS

## 2015-01-19 MED ORDER — POLYVINYL ALCOHOL 1.4 % OP SOLN
1.0000 [drp] | Freq: Two times a day (BID) | OPHTHALMIC | Status: DC | PRN
  Administered 2015-01-20 – 2015-01-22 (×2): 1 [drp] via OPHTHALMIC
  Filled 2015-01-19 (×2): qty 15

## 2015-01-19 MED ORDER — SODIUM CHLORIDE 0.9 % IJ SOLN: 3.0000 mL | INTRAMUSCULAR | Status: DC | PRN

## 2015-01-19 MED ORDER — ACETAMINOPHEN 325 MG PO TABS
650.0000 mg | ORAL_TABLET | Freq: Four times a day (QID) | ORAL | Status: DC | PRN
  Administered 2015-01-21 – 2015-01-22 (×3): 650 mg via ORAL
  Filled 2015-01-19 (×3): qty 2

## 2015-01-19 MED ORDER — VANCOMYCIN HCL 500 MG IV SOLR
500.0000 mg | INTRAVENOUS | Status: DC
  Administered 2015-01-21 – 2015-01-23 (×2): 500 mg via INTRAVENOUS
  Filled 2015-01-19 (×3): qty 500

## 2015-01-19 MED ORDER — FENTANYL CITRATE (PF) 100 MCG/2ML IJ SOLN
50.0000 ug | Freq: Once | INTRAMUSCULAR | Status: AC
  Administered 2015-01-19: 50 ug via INTRAVENOUS
  Filled 2015-01-19: qty 2

## 2015-01-19 MED ORDER — CLONIDINE HCL 0.1 MG PO TABS
0.1000 mg | ORAL_TABLET | Freq: Every day | ORAL | Status: DC
  Administered 2015-01-20 – 2015-01-25 (×6): 0.1 mg via ORAL
  Filled 2015-01-19 (×6): qty 1

## 2015-01-19 MED ORDER — AMLODIPINE BESYLATE 10 MG PO TABS
10.0000 mg | ORAL_TABLET | Freq: Every day | ORAL | Status: DC
  Administered 2015-01-20 – 2015-01-25 (×6): 10 mg via ORAL
  Filled 2015-01-19 (×6): qty 1

## 2015-01-19 MED ORDER — OXYCODONE HCL 5 MG PO TABS
2.5000 mg | ORAL_TABLET | Freq: Four times a day (QID) | ORAL | Status: DC | PRN
  Administered 2015-01-19 – 2015-01-20 (×2): 2.5 mg via ORAL
  Filled 2015-01-19 (×2): qty 1

## 2015-01-19 MED ORDER — FUROSEMIDE 40 MG PO TABS: 80.0000 mg | ORAL_TABLET | Freq: Every day | ORAL | Status: DC

## 2015-01-19 NOTE — Progress Notes (Addendum)
ANTIBIOTIC CONSULT NOTE - INITIAL  Pharmacy Consult for vancomcyin Indication: osteomyelitis  No Known Allergies  Patient Measurements: Height: 5' (152.4 cm) Weight: 128 lb (58.06 kg) IBW/kg (Calculated) : 45.5 Adjusted Body Weight:   Vital Signs: Temp: 98.3 F (36.8 C) (12/31 1420) Temp Source: Oral (12/31 1420) BP: 158/58 mmHg (12/31 1420) Pulse Rate: 66 (12/31 1420) Intake/Output from previous day:   Intake/Output from this shift:    Labs: No results for input(s): WBC, HGB, PLT, LABCREA, CREATININE in the last 72 hours. CrCl cannot be calculated (Patient has no serum creatinine result on file.). No results for input(s): VANCOTROUGH, VANCOPEAK, VANCORANDOM, GENTTROUGH, GENTPEAK, GENTRANDOM, TOBRATROUGH, TOBRAPEAK, TOBRARND, AMIKACINPEAK, AMIKACINTROU, AMIKACIN in the last 72 hours.   Microbiology: No results found for this or any previous visit (from the past 720 hour(s)).  Medical History: Past Medical History  Diagnosis Date  . Hypertension   . Shingles    Assessment: Patient's a 81 y.o F presented to the ED on 12/31 with L left swelling/wound.  Home RN noted increased drainage and foul odor during dressing change today.  She was on doxycycline PTA for leg wound. MRI of LLE did not reveal osteomyelitis or abscess.   Anticoagulation:  Infectious Disease: wound infection - CKD stage V  12/31 vanc>>  12/31 blood:   Goal of Therapy:  Vancomycin trough level 10-15 mcg/ml for wound without evidence of osteomyelitis per MRI  Plan:   Vancomycin 1gm x 1 then 500mg  IV 48h  Consider checking random level morning of 1/2 to determine if should be redosed  Adjust dose or change to dose per levels based on changes in SCr  Doreene Eland, PharmD, BCPS.   Pager: [phone] 01/19/2015 4:10 PM

## 2015-01-19 NOTE — ED Notes (Signed)
Family reports pt sent from evaluation for left leg swelling/wound; pt denies pain.

## 2015-01-19 NOTE — ED Provider Notes (Signed)
CSN: [id number]     Arrival date & time 01/19/15  1405 History   First MD Initiated Contact with Patient 01/19/15 1458     Chief Complaint  Patient presents with  . Leg Swelling     (Consider location/radiation/quality/duration/timing/severity/associated sxs/prior Treatment) HPI Comments: The patient is a 81 year old female, she has a history of stage V kidney disease as well as hypertension, presents to the hospital today because of increased swelling and drainage from her left lower extremity. She has known chronic swelling of the leg, she has had infection in the leg and is currently taking doxycycline, there is a nurse that is coming out to the house every day to change her dressings however over the last 24 hours she has had increased drainage, the dressings are soaking through with foul-smelling drainage. There is no fevers chills nausea vomiting coughing or decrease in appetite. The family was informed that they needed to come to the hospital for evaluation of these potential infection.  The history is provided by the patient.    Past Medical History  Diagnosis Date  . Hypertension   . Shingles    Past Surgical History  Procedure Laterality Date  . Colon surgery     Family History  Problem Relation Age of Onset  . Family history unknown: Yes   Social History  Substance Use Topics  . Smoking status: Never Smoker   . Smokeless tobacco: Never Used  . Alcohol Use: No   OB History    No data available     Review of Systems  All other systems reviewed and are negative.     Allergies  Review of patient's allergies indicates no known allergies.  Home Medications   Prior to Admission medications   Medication Sig Start Date End Date Taking? Authorizing Provider  amLODipine (NORVASC) 10 MG tablet Take 1 tablet (10 mg total) by mouth daily. 10/04/12  Yes [name], MD  cholecalciferol (VITAMIN D) 1000 UNITS tablet Take 5,000 Units by mouth daily.   Yes  Historical Provider, MD  cloNIDine (CATAPRES) 0.1 MG tablet Take 1 tablet (0.1 mg total) by mouth 3 (three) times daily. Patient taking differently: Take 0.1 mg by mouth daily.  09/04/14  Yes Bonnielee Haff, MD  doxycycline (VIBRA-TABS) 100 MG tablet Take 100 mg by mouth 2 (two) times daily.   Yes Historical Provider, MD  ferrous sulfate 325 (65 FE) MG tablet Take 325 mg by mouth 2 (two) times daily with a meal.   Yes Historical Provider, MD  furosemide (LASIX) 80 MG tablet Take 80 mg by mouth daily.   Yes Historical Provider, MD  labetalol (NORMODYNE) 100 MG tablet Take 100 mg by mouth 2 (two) times daily.   Yes Historical Provider, MD  lipase/protease/amylase (CREON) 36000 UNITS CPEP capsule Take 36,000 Units by mouth 3 (three) times daily as needed (with meals).    Yes Historical Provider, MD  metolazone (ZAROXOLYN) 2.5 MG tablet Take 2.5 mg by mouth daily. 30 minutes before Lasix   Yes Historical Provider, MD  polyethylene glycol (MIRALAX / GLYCOLAX) packet Take 17 g by mouth daily.   Yes Historical Provider, MD  Polyvinyl Alcohol-Povidone (REFRESH OP) Apply 1 drop to eye daily as needed (dry eyes).   Yes Historical Provider, MD  PRESCRIPTION MEDICATION Place 1 drop into both eyes daily. Unable to verify Name of eye drops at this time, pt receives samples from [facility] office.   Yes Historical Provider, MD  ciprofloxacin (CIPRO) 500 MG tablet Take  1 tablet (500 mg total) by mouth daily with breakfast. For 5 more days. Patient not taking: Reported on 01/19/2015 09/04/14   Bonnielee Haff, MD  docusate sodium 100 MG CAPS Take 100 mg by mouth 2 (two) times daily. Patient not taking: Reported on 01/19/2015 10/04/12   [name], MD  lipase/protease/amylase (CREON-12/PANCREASE) 12000 UNITS CPEP capsule Take 2 capsules by mouth 3 (three) times daily before meals. Patient not taking: Reported on 08/30/2014 10/04/12   [name], MD   BP 166/55 mmHg  Pulse 84  Temp(Src) 98.3 F (36.8 C) (Oral)   Resp 16  Ht 5' (1.524 m)  Wt 128 lb (58.06 kg)  BMI 25.00 kg/m2  SpO2 96% Physical Exam  Constitutional: She appears well-developed and well-nourished. No distress.  HENT:  Head: Normocephalic and atraumatic.  Mouth/Throat: Oropharynx is clear and moist. No oropharyngeal exudate.  Eyes: Conjunctivae and EOM are normal. Pupils are equal, round, and reactive to light. Right eye exhibits no discharge. Left eye exhibits no discharge. No scleral icterus.  Neck: Normal range of motion. Neck supple. No JVD present. No thyromegaly present.  Cardiovascular: Normal rate, regular rhythm, normal heart sounds and intact distal pulses.  Exam reveals no gallop and no friction rub.   No murmur heard. Pulmonary/Chest: Effort normal and breath sounds normal. No respiratory distress. She has no wheezes. She has no rales.  Abdominal: Soft. Bowel sounds are normal. She exhibits no distension and no mass. There is no tenderness.  Musculoskeletal: Normal range of motion. She exhibits edema and tenderness.  Left lower extremity with several open draining wounds with purulent material, this extends from the proximal lower extremity below the knee to the ankle on the left side, there is slight redness to the skin, and tenderness over this area  Lymphadenopathy:    She has no cervical adenopathy.  Neurological: She is alert. Coordination normal.  Skin: Skin is warm and dry. Rash noted. There is erythema.  Psychiatric: She has a normal mood and affect. Her behavior is normal.  Nursing note and vitals reviewed.   ED Course  Procedures (including critical care time) Labs Review Labs Reviewed  CBC WITH DIFFERENTIAL/PLATELET - Abnormal; Notable for the following:    WBC 21.7 (*)    RBC 2.68 (*)    Hemoglobin 7.9 (*)    HCT 23.2 (*)    Neutro Abs 18.9 (*)    Monocytes Absolute 1.7 (*)    All other components within normal limits  BRAIN NATRIURETIC PEPTIDE - Abnormal; Notable for the following:    B  Natriuretic Peptide 235.8 (*)    All other components within normal limits  CULTURE, BLOOD (ROUTINE X 2)  CULTURE, BLOOD (ROUTINE X 2)  COMPREHENSIVE METABOLIC PANEL  I-STAT CG4 LACTIC ACID, ED  I-STAT CG4 LACTIC ACID, ED    Imaging Review Mr Tibia Fibula Left Wo Contrast  01/19/2015  CLINICAL DATA:  Infection with left leg swelling. Nonhealing wound along the left calf. EXAM: MRI OF LOWER LEFT EXTREMITY WITHOUT CONTRAST TECHNIQUE: Multiplanar, multisequence MR imaging of the left calf was performed. No intravenous contrast was administered. COMPARISON:  08/30/2014 FINDINGS: Severe motion artifact is present and reduces diagnostic sensitivity and specificity. There is extensive diffuse subcutaneous edema along the knees and lower legs bilaterally. On the left side, posterior soft tissue defect overlying the gastrocnemius musculature is noted in the proximal calf, tracking down to the mid calf region. Further distally, along the lateral lower calf and extending over a 10.6 cm  excursion, there is a subcutaneous tissue defect in the left calf with irregular margins of the subcutaneous tissues and concern for overlying cutaneous ulceration. No drainable abscess is identified on either side. No discrete osteomyelitis is identified. I am doubtful of myositis given the appearance although subtle myositis could be occult due to the severe degree of motion artifact. IMPRESSION: 1. Cutaneous and subcutaneous ulceration/defect in the distal lateral calf and in the proximal posterior calf, without underlying abscess or osteomyelitis identified. 2. No definite myositis although negative predictive value is reduced due to the severity of motion artifact. 3. Diffuse subcutaneous edema around both knees and both calves, nonspecific but possibly from venous insufficiency or cellulitis. Electronically Signed   By: Van Clines M.D.   On: 01/19/2015 17:25   I have personally reviewed and evaluated these images and  lab results as part of my medical decision-making.    MDM   Final diagnoses:  Infection  Abscess of left lower leg    The patient is in no distress however her leg is looking worse, she does have poor kidney function, relatively immunosuppressed given this, she is on doxycycline but continues to have a purulent abscess and cellulitis, we'll obtain imaging of the leg as well as labs and likely admission for IV antibiotics.  MR reviewed Leukocytosis Abx ordered and given  D/w Dr. Olevia Bowens who will admit.    Noemi Chapel, MD 01/19/15 [phone]

## 2015-01-19 NOTE — Progress Notes (Signed)
ANTIBIOTIC CONSULT NOTE - INITIAL  Pharmacy Consult for zosyn Indication: Cellulitis  No Known Allergies  Patient Measurements: Height: 5' (152.4 cm) Weight: 123 lb 4.8 oz (55.929 kg) IBW/kg (Calculated) : 45.5  Vital Signs: Temp: 97.9 F (36.6 C) (12/31 2110) Temp Source: Oral (12/31 2110) BP: 171/56 mmHg (12/31 2110) Pulse Rate: 85 (12/31 2110) Intake/Output from previous day:   Intake/Output from this shift:    Labs:  Recent Labs  01/19/15 1608 01/19/15 1814  WBC 21.7*  --   HGB 7.9*  --   PLT 320  --   CREATININE  --  4.54*   Estimated Creatinine Clearance: 6.1 mL/min (by C-G formula based on Cr of 4.54). No results for input(s): VANCOTROUGH, VANCOPEAK, VANCORANDOM, GENTTROUGH, GENTPEAK, GENTRANDOM, TOBRATROUGH, TOBRAPEAK, TOBRARND, AMIKACINPEAK, AMIKACINTROU, AMIKACIN in the last 72 hours.   Microbiology: No results found for this or any previous visit (from the past 720 hour(s)).  Medical History: Past Medical History  Diagnosis Date  . Hypertension   . Shingles   . History of bladder cancer 1970's   Assessment: Patient known to pharmacy from Vancomycin dosing. Patient's a 81 y.o F presented to the ED on 12/31 with L left swelling/wound.  Home RN noted increased drainage and foul odor during dressing change today.  She was on doxycycline PTA for leg wound. MRI of LLE did not reveal osteomyelitis or abscess.   Upon admission, pharmacy consulted to dose Zosyn for cellulitis  Anticoagulation:  Infectious Disease: wound infection - CKD stage V  12/31 vanc>> 12/31 zosyn >>  12/31 blood:   Goal of Therapy:  Vancomycin trough level 10-15 mcg/ml for wound without evidence of osteomyelitis per MRI Dosing of zosyn based on renal function  Plan:   Continue Vancomycin  Zosyn 2.25gm IV q8h  F/u cultures, monitor renal function  Leone Haven, PharmD  01/19/2015 9:33 PM

## 2015-01-20 DIAGNOSIS — L03116 Cellulitis of left lower limb: Principal | ICD-10-CM

## 2015-01-20 DIAGNOSIS — L8990 Pressure ulcer of unspecified site, unspecified stage: Secondary | ICD-10-CM | POA: Insufficient documentation

## 2015-01-20 LAB — CBC WITH DIFFERENTIAL/PLATELET
BASOS ABS: 0 10*3/uL (ref 0.0–0.1)
BASOS PCT: 0 %
EOS ABS: 0 10*3/uL (ref 0.0–0.7)
Eosinophils Relative: 0 %
HCT: 22.4 % — ABNORMAL LOW (ref 36.0–46.0)
HEMOGLOBIN: 7.3 g/dL — AB (ref 12.0–15.0)
LYMPHS PCT: 6 %
Lymphs Abs: 1.5 10*3/uL (ref 0.7–4.0)
MCH: 28.1 pg (ref 26.0–34.0)
MCHC: 32.6 g/dL (ref 30.0–36.0)
MCV: 86.2 fL (ref 78.0–100.0)
Monocytes Absolute: 1.2 10*3/uL — ABNORMAL HIGH (ref 0.1–1.0)
Monocytes Relative: 5 %
NEUTROS PCT: 89 %
Neutro Abs: 21.8 10*3/uL — ABNORMAL HIGH (ref 1.7–7.7)
Platelets: 313 10*3/uL (ref 150–400)
RBC: 2.6 MIL/uL — AB (ref 3.87–5.11)
RDW: 14.5 % (ref 11.5–15.5)
WBC: 24.5 10*3/uL — AB (ref 4.0–10.5)

## 2015-01-20 LAB — COMPREHENSIVE METABOLIC PANEL
ALK PHOS: 68 U/L (ref 38–126)
ALT: 24 U/L (ref 14–54)
ANION GAP: 14 (ref 5–15)
AST: 27 U/L (ref 15–41)
Albumin: 2 g/dL — ABNORMAL LOW (ref 3.5–5.0)
BUN: 112 mg/dL — ABNORMAL HIGH (ref 6–20)
CALCIUM: 7.9 mg/dL — AB (ref 8.9–10.3)
CO2: 19 mmol/L — AB (ref 22–32)
Chloride: 102 mmol/L (ref 101–111)
Creatinine, Ser: 4.34 mg/dL — ABNORMAL HIGH (ref 0.44–1.00)
GFR calc non Af Amer: 8 mL/min — ABNORMAL LOW (ref >60)
GFR, EST AFRICAN AMERICAN: 9 mL/min — AB (ref >60)
Glucose, Bld: 156 mg/dL — ABNORMAL HIGH (ref 65–99)
Potassium: 3.3 mmol/L — ABNORMAL LOW (ref 3.5–5.1)
SODIUM: 135 mmol/L (ref 135–145)
Total Bilirubin: 0.8 mg/dL (ref 0.3–1.2)
Total Protein: 5.8 g/dL — ABNORMAL LOW (ref 6.5–8.1)

## 2015-01-20 MED ORDER — SODIUM CHLORIDE 0.9 % IV SOLN
INTRAVENOUS | Status: DC
  Administered 2015-01-20 – 2015-01-21 (×3): via INTRAVENOUS

## 2015-01-20 MED ORDER — DIPHENHYDRAMINE HCL 25 MG PO CAPS
25.0000 mg | ORAL_CAPSULE | Freq: Three times a day (TID) | ORAL | Status: DC | PRN
  Administered 2015-01-20 – 2015-01-24 (×2): 25 mg via ORAL
  Filled 2015-01-20 (×3): qty 1

## 2015-01-20 NOTE — H&P (Signed)
[facility] History and Physical  [name] [id number] DOB: [birth year] DOA: 01/19/2015  Referring physician: Noemi Chapel, M.D. PCP: [name], MD   Chief Complaint: Leg swelling.  HPI: [name] is a 82 y.o. female with below past medical history, known to me from a previous encounter, who is brought in by her daughter due to worsening of her lower extremity edema and left lower extremity wound.   Per daughter, she has had chronic swelling of her legs for a while. She developed some oozing lesions who until the past few days were discharging serous fluid. However, in the last two to three days, she has noticed some foul smelling purulent discharge from the wounds. She denies fever, chills, but states that the patient seems to be more tired.  When seen the patient was somnolent due to recent analgesic administration, but in NAD.  Review of Systems:  Unable to review. History provided by her daughter.  Past Medical History  Diagnosis Date  . Hypertension   . Shingles   . History of bladder cancer 1970's   Past Surgical History  Procedure Laterality Date  . Cystectomy w/ continent diversion Left    Social History:  reports that she has never smoked. She has never used smokeless tobacco. She reports that she does not drink alcohol or use illicit drugs.  No Known Allergies  Family History  Problem Relation Age of Onset  . Family history unknown: Yes    Prior to Admission medications   Medication Sig Start Date End Date Taking? Authorizing Provider  amLODipine (NORVASC) 10 MG tablet Take 1 tablet (10 mg total) by mouth daily. 10/04/12  Yes [name], MD  cholecalciferol (VITAMIN D) 1000 UNITS tablet Take 5,000 Units by mouth daily.   Yes Historical Provider, MD  cloNIDine (CATAPRES) 0.1 MG tablet Take 1 tablet (0.1 mg total) by mouth 3 (three) times daily. Patient taking differently: Take 0.1 mg by mouth daily.  09/04/14  Yes Bonnielee Haff, MD   doxycycline (VIBRA-TABS) 100 MG tablet Take 100 mg by mouth 2 (two) times daily.   Yes Historical Provider, MD  ferrous sulfate 325 (65 FE) MG tablet Take 325 mg by mouth 2 (two) times daily with a meal.   Yes Historical Provider, MD  furosemide (LASIX) 80 MG tablet Take 80 mg by mouth daily.   Yes Historical Provider, MD  labetalol (NORMODYNE) 100 MG tablet Take 100 mg by mouth 2 (two) times daily.   Yes Historical Provider, MD  lipase/protease/amylase (CREON) 36000 UNITS CPEP capsule Take 36,000 Units by mouth 3 (three) times daily as needed (with meals).    Yes Historical Provider, MD  metolazone (ZAROXOLYN) 2.5 MG tablet Take 2.5 mg by mouth daily. 30 minutes before Lasix   Yes Historical Provider, MD  polyethylene glycol (MIRALAX / GLYCOLAX) packet Take 17 g by mouth daily.   Yes Historical Provider, MD  Polyvinyl Alcohol-Povidone (REFRESH OP) Apply 1 drop to eye daily as needed (dry eyes).   Yes Historical Provider, MD  PRESCRIPTION MEDICATION Place 1 drop into both eyes daily. Unable to verify Name of eye drops at this time, pt receives samples from [facility] office.   Yes Historical Provider, MD  ciprofloxacin (CIPRO) 500 MG tablet Take 1 tablet (500 mg total) by mouth daily with breakfast. For 5 more days. Patient not taking: Reported on 01/19/2015 09/04/14   Bonnielee Haff, MD  docusate sodium 100 MG CAPS Take 100 mg by mouth 2 (two) times  daily. Patient not taking: Reported on 01/19/2015 10/04/12   [name], MD  lipase/protease/amylase (CREON-12/PANCREASE) 12000 UNITS CPEP capsule Take 2 capsules by mouth 3 (three) times daily before meals. Patient not taking: Reported on 08/30/2014 10/04/12   [name], MD   Physical Exam: Filed Vitals:   01/19/15 1420 01/19/15 1756 01/19/15 2006 01/19/15 2110  BP: 158/58 166/55 169/73 171/56  Pulse: 66 84 84 85  Temp: 98.3 F (36.8 C)   97.9 F (36.6 C)  TempSrc: Oral   Oral  Resp: 16   20  Height: 5' (1.524 m)   5' (1.524 m)    Weight: 58.06 kg (128 lb)   55.929 kg (123 lb 4.8 oz)  SpO2: 97% 96% 100% 97%    Wt Readings from Last 3 Encounters:  01/19/15 55.929 kg (123 lb 4.8 oz)  01/19/15 58.06 kg (128 lb)  08/31/14 52 kg (114 lb 10.2 oz)    General:  Appears calm and comfortable Eyes: PERRL, normal lids, irises & conjunctiva ENT: grossly normal hearing, lips & tongue Neck: no LAD, masses or thyromegaly Cardiovascular: RRR, no m/r/g. Positive LE edema. Telemetry: SR, no arrhythmias Respiratory: CTA bilaterally, no w/r/r. Normal respiratory effort. Abdomen: soft, ntnd Skin: Postive erythema and edema on LLE from her L knee down to her left ankle area. Musculoskeletal: Left lower extremity shows erythema, edema and multiple open purulent discharge draining wounds. The area is tender to palpation. Psychiatric: grossly normal mood and affect, speech fluent and appropriate Neurologic: Somnolent, unable to perform.          Labs on Admission:  Basic Metabolic Panel:  Recent Labs Lab 01/19/15 1814  NA 133*  K 3.5  CL 99*  CO2 18*  GLUCOSE 159*  BUN 115*  CREATININE 4.54*  CALCIUM 8.0*  MG 1.3*  PHOS 4.7*   Liver Function Tests:  Recent Labs Lab 01/19/15 1814  AST 31  ALT 30  ALKPHOS 81  BILITOT 0.8  PROT 6.6  ALBUMIN 2.5*   CBC:  Recent Labs Lab 01/19/15 1608  WBC 21.7*  NEUTROABS 18.9*  HGB 7.9*  HCT 23.2*  MCV 86.6  PLT 320    BNP (last 3 results)  Recent Labs  08/30/14 1928 01/19/15 1608  BNP 395.6* 235.8*    Radiological Exams on Admission: Mr Tibia Fibula Left Wo Contrast  01/19/2015  CLINICAL DATA:  Infection with left leg swelling. Nonhealing wound along the left calf. EXAM: MRI OF LOWER LEFT EXTREMITY WITHOUT CONTRAST TECHNIQUE: Multiplanar, multisequence MR imaging of the left calf was performed. No intravenous contrast was administered. COMPARISON:  08/30/2014 FINDINGS: Severe motion artifact is present and reduces diagnostic sensitivity and specificity.  There is extensive diffuse subcutaneous edema along the knees and lower legs bilaterally. On the left side, posterior soft tissue defect overlying the gastrocnemius musculature is noted in the proximal calf, tracking down to the mid calf region. Further distally, along the lateral lower calf and extending over a 10.6 cm excursion, there is a subcutaneous tissue defect in the left calf with irregular margins of the subcutaneous tissues and concern for overlying cutaneous ulceration. No drainable abscess is identified on either side. No discrete osteomyelitis is identified. I am doubtful of myositis given the appearance although subtle myositis could be occult due to the severe degree of motion artifact. IMPRESSION: 1. Cutaneous and subcutaneous ulceration/defect in the distal lateral calf and in the proximal posterior calf, without underlying abscess or osteomyelitis identified. 2. No definite myositis although negative predictive  value is reduced due to the severity of motion artifact. 3. Diffuse subcutaneous edema around both knees and both calves, nonspecific but possibly from venous insufficiency or cellulitis. Electronically Signed   By: Van Clines M.D.   On: 01/19/2015 17:25      Assessment/Plan Principal Problem:   Cellulitis of left lower extremity   Abscess of left lower leg Admit to med-surg. Continue broad spectrum IV antibiotics. Continue analgesics. Consider consulting general surgery if no improvement.  Active Problems:   Hypertension Continue labetalol 100 mg po bid. Furosemide 80 mg po daily.    Chronic kidney disease, stage 4, severely decreased GFR (HCC) Monitor BUN and creatinine.    History urinary bladder cancer with diversion conduit [facility]) Continue supportive care.    Pancreatic insufficiency (HCC) Continue pancreatic enzymes supplementation.    Constipation Continue Miralax and stool softener.   Code Status: DNR/DNI DVT Prophylaxis: Lovenox SQ.  Family  Communication: Her daughter was by bedside.  Disposition Plan: Admit for IV antibiotic therapy.   Time spent: Over 70 minutes were spent during the process of this admission.   Reubin Milan [facility] Pager [phone].

## 2015-01-20 NOTE — Progress Notes (Signed)
Dressing to LLE changed. Moderate amount pureleunt drainage noted on old dressing. Cleansed wound with Normal Saline and reapplied DSD wrapped with kerlex.

## 2015-01-20 NOTE — Progress Notes (Signed)
[facility] PROGRESS NOTE  [name] [id number] DOB: November 02, 1919 DOA: 01/19/2015  PCP: [name], MD  Brief HPI: 82 year old African-American female, past medical history of hypertension, presented with the complaints of worsening lower extremity edema and worsening left lower extremity wound with purulent drainage. Patient was hospitalized for further management.  Past medical history:  Past Medical History  Diagnosis Date  . Hypertension   . Shingles   . History of bladder cancer 1970's    Consultants: None  Procedures: None  Antibiotics: Vancomycin and Zosyn  Subjective: Patient complains of pain in her legs. Denies any nausea, vomiting or abdominal pain. Her granddaughter is at the bedside. Not a very good historian.  Objective: Vital Signs  Filed Vitals:   01/19/15 1756 01/19/15 2006 01/19/15 2110 01/20/15 0617  BP: 166/55 169/73 171/56 171/55  Pulse: 84 84 85 82  Temp:   97.9 F (36.6 C) 98.9 F (37.2 C)  TempSrc:   Oral Oral  Resp:   20 18  Height:   5' (1.524 m)   Weight:   55.929 kg (123 lb 4.8 oz)   SpO2: 96% 100% 97% 96%    Intake/Output Summary (Last 24 hours) at 01/20/15 1249 Last data filed at 01/20/15 1113  Gross per 24 hour  Intake      0 ml  Output   1000 ml  Net  -1000 ml   Filed Weights   01/19/15 1420 01/19/15 2110  Weight: 58.06 kg (128 lb) 55.929 kg (123 lb 4.8 oz)    General appearance: alert, distracted and no distress Resp: clear to auscultation bilaterally Cardio: regular rate and rhythm, S1, S2 normal, no murmur, click, rub or gallop GI: soft, non-tender; bowel sounds normal; no masses,  no organomegaly. Diverting conduit noted. Extremities: Left lower extremity is covered in a dressing. Warmth is noted. No obvious erythema. Swelling is present bilateral lower extremities. Neurologic: Awake, alert, distracted. Moving all her extremities. No focal deficits.  Lab Results:  Basic Metabolic Panel:  Recent  Labs Lab 01/19/15 1814 01/20/15 0450  NA 133* 135  K 3.5 3.3*  CL 99* 102  CO2 18* 19*  GLUCOSE 159* 156*  BUN 115* 112*  CREATININE 4.54* 4.34*  CALCIUM 8.0* 7.9*  MG 1.3*  --   PHOS 4.7*  --    Liver Function Tests:  Recent Labs Lab 01/19/15 1814 01/20/15 0450  AST 31 27  ALT 30 24  ALKPHOS 81 68  BILITOT 0.8 0.8  PROT 6.6 5.8*  ALBUMIN 2.5* 2.0*   CBC:  Recent Labs Lab 01/19/15 1608 01/20/15 0450  WBC 21.7* 24.5*  NEUTROABS 18.9* 21.8*  HGB 7.9* 7.3*  HCT 23.2* 22.4*  MCV 86.6 86.2  PLT 320 313   BNP (last 3 results)  Recent Labs  08/30/14 1928 01/19/15 1608  BNP 395.6* 235.8*     Studies/Results: Mr Tibia Fibula Left Wo Contrast  01/19/2015  CLINICAL DATA:  Infection with left leg swelling. Nonhealing wound along the left calf. EXAM: MRI OF LOWER LEFT EXTREMITY WITHOUT CONTRAST TECHNIQUE: Multiplanar, multisequence MR imaging of the left calf was performed. No intravenous contrast was administered. COMPARISON:  08/30/2014 FINDINGS: Severe motion artifact is present and reduces diagnostic sensitivity and specificity. There is extensive diffuse subcutaneous edema along the knees and lower legs bilaterally. On the left side, posterior soft tissue defect overlying the gastrocnemius musculature is noted in the proximal calf, tracking down to the mid calf region. Further distally, along the lateral lower calf  and extending over a 10.6 cm excursion, there is a subcutaneous tissue defect in the left calf with irregular margins of the subcutaneous tissues and concern for overlying cutaneous ulceration. No drainable abscess is identified on either side. No discrete osteomyelitis is identified. I am doubtful of myositis given the appearance although subtle myositis could be occult due to the severe degree of motion artifact. IMPRESSION: 1. Cutaneous and subcutaneous ulceration/defect in the distal lateral calf and in the proximal posterior calf, without underlying  abscess or osteomyelitis identified. 2. No definite myositis although negative predictive value is reduced due to the severity of motion artifact. 3. Diffuse subcutaneous edema around both knees and both calves, nonspecific but possibly from venous insufficiency or cellulitis. Electronically Signed   By: Van Clines M.D.   On: 01/19/2015 17:25    Medications:  Scheduled: . amLODipine  10 mg Oral Daily  . cholecalciferol  5,000 Units Oral Daily  . cloNIDine  0.1 mg Oral Daily  . enoxaparin (LOVENOX) injection  30 mg Subcutaneous Q24H  . ferrous sulfate  325 mg Oral BID WC  . [START ON 01/25/2015] ferumoxytol  510 mg Intravenous Weekly  . labetalol  100 mg Oral BID  . pantoprazole  40 mg Oral Daily  . piperacillin-tazobactam (ZOSYN)  IV  2.25 g Intravenous Q8H  . polyethylene glycol  17 g Oral Daily  . sodium chloride  3 mL Intravenous Q12H  . [START ON 01/21/2015] vancomycin  500 mg Intravenous Q48H   Continuous: . sodium chloride     [id number] chloride, acetaminophen **OR** acetaminophen, diphenhydrAMINE, lipase/protease/amylase, ondansetron **OR** ondansetron (ZOFRAN) IV, oxyCODONE, polyvinyl alcohol, sodium chloride  Assessment/Plan:  Principal Problem:   Cellulitis of left lower extremity Active Problems:   Hypertension   Chronic kidney disease, stage 4, severely decreased GFR (HCC)   Urinary bladder cancer (HCC)   Pancreatic insufficiency (HCC)   Constipation   Abscess of left lower leg   Pressure ulcer    Cellulitis of left lower extremity No abscess noted on MRI. WBC noted to be significantly elevated. Continue broad-spectrum antibiotics. Continue analgesics.   Acute on Chronic kidney disease, stage 4, severely decreased GFR with hypokalemia Patient's creatinine was 1.96 and in August. Her BUN and creatinine are significantly worse compared to those values. She is followed by a nephrologist in [place]. Continue gentle IV hydration for now. Monitor urine output  closely. Hold diuretics for now.  Essential Hypertension Continue labetalol 100 mg po bid.  History urinary bladder cancer with diversion conduit Continue supportive care.  Pancreatic insufficiency Continue pancreatic enzymes supplementation.  Constipation Continue Miralax and stool softener.  History of glaucoma Stable  Normocytic anemia Hemoglobin is stable. Likely due to chronic kidney disease.  DVT Prophylaxis: Lovenox    Code Status: DO NOT RESUSCITATE  Family Communication: Discussed with her granddaughter  Disposition Plan: Await improvement. Continue current management. Patient lives at home and her family provides care.    LOS: 1 day   [facility] Pager [phone] 01/20/2015, 12:49 PM  If 7PM-7AM, please contact night-coverage at [email or web address], password [facility]

## 2015-01-21 ENCOUNTER — Inpatient Hospital Stay (HOSPITAL_COMMUNITY): Payer: Commercial Managed Care - HMO

## 2015-01-21 DIAGNOSIS — N184 Chronic kidney disease, stage 4 (severe): Secondary | ICD-10-CM

## 2015-01-21 DIAGNOSIS — I10 Essential (primary) hypertension: Secondary | ICD-10-CM

## 2015-01-21 DIAGNOSIS — J810 Acute pulmonary edema: Secondary | ICD-10-CM

## 2015-01-21 LAB — BASIC METABOLIC PANEL
ANION GAP: 15 (ref 5–15)
BUN: 104 mg/dL — ABNORMAL HIGH (ref 6–20)
CHLORIDE: 105 mmol/L (ref 101–111)
CO2: 19 mmol/L — AB (ref 22–32)
Calcium: 8.1 mg/dL — ABNORMAL LOW (ref 8.9–10.3)
Creatinine, Ser: 4.52 mg/dL — ABNORMAL HIGH (ref 0.44–1.00)
GFR calc non Af Amer: 8 mL/min — ABNORMAL LOW (ref >60)
GFR, EST AFRICAN AMERICAN: 9 mL/min — AB (ref >60)
Glucose, Bld: 121 mg/dL — ABNORMAL HIGH (ref 65–99)
Potassium: 3.3 mmol/L — ABNORMAL LOW (ref 3.5–5.1)
Sodium: 139 mmol/L (ref 135–145)

## 2015-01-21 LAB — CBC
HEMATOCRIT: 25.7 % — AB (ref 36.0–46.0)
HEMOGLOBIN: 8.3 g/dL — AB (ref 12.0–15.0)
MCH: 27.9 pg (ref 26.0–34.0)
MCHC: 32.3 g/dL (ref 30.0–36.0)
MCV: 86.5 fL (ref 78.0–100.0)
Platelets: 390 10*3/uL (ref 150–400)
RBC: 2.97 MIL/uL — ABNORMAL LOW (ref 3.87–5.11)
RDW: 14.5 % (ref 11.5–15.5)
WBC: 31.2 10*3/uL — AB (ref 4.0–10.5)

## 2015-01-21 LAB — VANCOMYCIN, RANDOM: Vancomycin Rm: 10 ug/mL

## 2015-01-21 MED ORDER — FUROSEMIDE 10 MG/ML IJ SOLN
60.0000 mg | Freq: Once | INTRAMUSCULAR | Status: AC
  Administered 2015-01-21: 60 mg via INTRAVENOUS
  Filled 2015-01-21: qty 6

## 2015-01-21 MED ORDER — LATANOPROST 0.005 % OP SOLN
1.0000 [drp] | Freq: Every day | OPHTHALMIC | Status: DC
  Administered 2015-01-21 – 2015-01-24 (×4): 1 [drp] via OPHTHALMIC
  Filled 2015-01-21: qty 2.5

## 2015-01-21 MED ORDER — POTASSIUM CHLORIDE CRYS ER 20 MEQ PO TBCR
20.0000 meq | EXTENDED_RELEASE_TABLET | Freq: Once | ORAL | Status: AC
  Administered 2015-01-21: 20 meq via ORAL
  Filled 2015-01-21: qty 1

## 2015-01-21 MED ORDER — COLLAGENASE 250 UNIT/GM EX OINT
TOPICAL_OINTMENT | Freq: Every day | CUTANEOUS | Status: DC
  Administered 2015-01-21 – 2015-01-24 (×4): via TOPICAL
  Administered 2015-01-25: 1 via TOPICAL
  Filled 2015-01-21: qty 30

## 2015-01-21 NOTE — Progress Notes (Signed)
ANTIBIOTIC CONSULT NOTE - follow up  Pharmacy Consult for vancomcyin, Zosyn Indication: wound infection  No Known Allergies  Patient Measurements: Height: 5' (152.4 cm) Weight: 123 lb 4.8 oz (55.929 kg) IBW/kg (Calculated) : 45.5 Adjusted Body Weight:   Vital Signs: Temp: 98.4 F (36.9 C) (01/02 0502) Temp Source: Oral (01/02 0502) BP: 154/60 mmHg (01/02 0502) Pulse Rate: 82 (01/02 0502) Intake/Output from previous day: 01/01 0701 - 01/02 0700 In: 1339.3 [P.O.:640; I.V.:549.3; IV Piggyback:150] Out: 200 [Urine:200] Intake/Output from this shift:    Labs:  Recent Labs  01/19/15 1608 01/19/15 1814 01/20/15 0450 01/21/15 0500  WBC 21.7*  --  24.5* 31.2*  HGB 7.9*  --  7.3* 8.3*  PLT 320  --  313 390  CREATININE  --  4.54* 4.34* 4.52*   Estimated Creatinine Clearance: 6.1 mL/min (by C-G formula based on Cr of 4.52).  Recent Labs  01/21/15 1230  VANCORANDOM 10     Microbiology: Recent Results (from the past 720 hour(s))  Blood culture (routine x 2)     Status: None (Preliminary result)   Collection Time: 01/19/15  4:08 PM  Result Value Ref Range Status   Specimen Description LEFT ANTECUBITAL  Final   Special Requests BOTTLES DRAWN AEROBIC AND ANAEROBIC 5CC  Final   Culture   Final    NO GROWTH 2 DAYS Performed at [facility]    Report Status PENDING  Incomplete  Blood culture (routine x 2)     Status: None (Preliminary result)   Collection Time: 01/19/15  4:23 PM  Result Value Ref Range Status   Specimen Description RIGHT ANTECUBITAL  Final   Special Requests BOTTLES DRAWN AEROBIC AND ANAEROBIC 5CC  Final   Culture   Final    NO GROWTH 2 DAYS Performed at [facility]    Report Status PENDING  Incomplete    Medical History: Past Medical History  Diagnosis Date  . Hypertension   . Shingles   . History of bladder cancer 1970's   Assessment: Patient's a 82 y.o F with CKD presented to the ED on 12/31 with L left swelling/wound.   Home RN noted increased drainage and foul odor during dressing change today.  She was on doxycycline PTA for leg wound. MRI of LLE did not reveal osteomyelitis or abscess.   - afebrile, WBC rising, SCr elevated but stable CrCl 6 ml/min - Vanc Random 10 - acceptable to redose patient  12/31 >> vanc >> 12/31 >> Zosyn >>  12/31 blood: ngtd  Goal of Therapy:  Vancomycin trough level 10-15 mcg/ml for wound without evidence of osteomyelitis per MRI  Plan:   Continue vancomycin 500mg  IV 48h - next today 1/2 at 2pm  Continue current Zosyn dosing   Adrian Saran, PharmD, BCPS Pager [phone] 01/21/2015 1:28 PM

## 2015-01-21 NOTE — Consult Note (Signed)
WOC wound consult note Reason for Consult: LE and sacrum Wound type:  Stage 2 Pressure injury sacrum and right ischium, skin care order set implemented appropriately per bedside nursing staff.  Foam and low air loss mattress in place  LE ulcers, daughter at bedside reports she has had Unna's boot on the LLE and long term compression stocking on the RLE.  New area on the RLE 0.2cm x 0.2cm x 0.1cm, clean, pink.  1+ edema with  Palpable pulses Full thickness ulceration on the left lateral calf, 9cm x 3cm x 0.2cm; 100% yellow, loose non viable slough covering the ulcer.  Palpable pulses, and no significant edema Pressure Ulcer POA: Yes Measurement: see above Wound bed: see above Drainage (amount, consistency, odor) serous at the RLE ulcer.  Moderate, yellow purulent drainage from the LLE.  Periwound: intact. Dressing procedure/placement/frequency: Discussed LLE with hospitalist, he has already spoken to general surgery and to vascular surgery.  No plans for surgical debridement at this time.  We will start enzymatic debridement with PT for pulsatile lavage at the bedside to attempt to clean up this wound.  Notified daughter of these plans. Will have orthopedic tech place Unna's boot to the RLE since there is a new area and she does have some swelling noted today.  Will need unnas boot changed again on Thursday either by inpatient ortho tech, or HHRN.   Noted at the time of my assessment also has urostomy in placed. WOC ostomy consult note Stoma type/location: LLQ, ileal conduit  Output clear, yellow uring Ostomy pouching: 1pc with adapter to bedside drainage bag  Education provided: patient dependent for care of her ostomy at home.  Supplies for ostomy ordered.   [place] team will follow along with you for support with wound and ostomy care.   Renny Gunnarson Gurley RN,CWOCN [id number]

## 2015-01-21 NOTE — Progress Notes (Signed)
Utilization review completed.  

## 2015-01-21 NOTE — Progress Notes (Signed)
[facility] PROGRESS NOTE  Jayonni Blom [place] [id number] DOB: April 09, 1919 DOA: 01/19/2015  PCP: [name], MD  Brief HPI: 82 year old African-American female, past medical history of hypertension, presented with the complaints of worsening lower extremity edema and worsening left lower extremity wound with purulent drainage. Patient was hospitalized for further management.  Past medical history:  Past Medical History  Diagnosis Date  . Hypertension   . Shingles   . History of bladder cancer 1970's    Consultants: None  Procedures: None  Antibiotics: Vancomycin and Zosyn  Subjective: Patient continues to have pain in the left leg. Daughter at bedside and concerned about patient's breathing patterns. Patient denies any shortness of breath.  Objective: Vital Signs  Filed Vitals:   01/20/15 1419 01/20/15 1428 01/20/15 2113 01/21/15 0502  BP: 165/52 165/52 166/54 154/60  Pulse: 86  81 82  Temp:   98.8 F (37.1 C) 98.4 F (36.9 C)  TempSrc:   Oral Oral  Resp:   20 16  Height:      Weight:      SpO2:   90% 91%    Intake/Output Summary (Last 24 hours) at 01/21/15 0806 Last data filed at 01/20/15 1547  Gross per 24 hour  Intake 1339.25 ml  Output    200 ml  Net 1139.25 ml   Filed Weights   01/19/15 1420 01/19/15 2110  Weight: 58.06 kg (128 lb) 55.929 kg (123 lb 4.8 oz)    General appearance: alert, distracted and no distress Resp: Menest and clear the bases. Few crackles appreciated. Cardio: regular rate and rhythm, S1, S2 normal, no murmur, click, rub or gallop GI: soft, non-tender; bowel sounds normal; no masses,  no organomegaly. Diverting conduit noted. Extremities: Left lower extremity is covered in a dressing which was removed. Ulcerated area noted in the lateral aspect of the left leg. Measuring about 8 cm in length. Yellowish exudate is noted. Some warmth is present. Swelling is present bilateral lower extremities. Neurologic: Awake, alert,  distracted. Moving all her extremities. No focal deficits.  Lab Results:  Basic Metabolic Panel:  Recent Labs Lab 01/19/15 1814 01/20/15 0450 01/21/15 0500  NA 133* 135 139  K 3.5 3.3* 3.3*  CL 99* 102 105  CO2 18* 19* 19*  GLUCOSE 159* 156* 121*  BUN 115* 112* 104*  CREATININE 4.54* 4.34* 4.52*  CALCIUM 8.0* 7.9* 8.1*  MG 1.3*  --   --   PHOS 4.7*  --   --    Liver Function Tests:  Recent Labs Lab 01/19/15 1814 01/20/15 0450  AST 31 27  ALT 30 24  ALKPHOS 81 68  BILITOT 0.8 0.8  PROT 6.6 5.8*  ALBUMIN 2.5* 2.0*   CBC:  Recent Labs Lab 01/19/15 1608 01/20/15 0450 01/21/15 0500  WBC 21.7* 24.5* 31.2*  NEUTROABS 18.9* 21.8*  --   HGB 7.9* 7.3* 8.3*  HCT 23.2* 22.4* 25.7*  MCV 86.6 86.2 86.5  PLT 320 313 390   BNP (last 3 results)  Recent Labs  08/30/14 1928 01/19/15 1608  BNP 395.6* 235.8*     Studies/Results: Mr Tibia Fibula Left Wo Contrast  01/19/2015  CLINICAL DATA:  Infection with left leg swelling. Nonhealing wound along the left calf. EXAM: MRI OF LOWER LEFT EXTREMITY WITHOUT CONTRAST TECHNIQUE: Multiplanar, multisequence MR imaging of the left calf was performed. No intravenous contrast was administered. COMPARISON:  08/30/2014 FINDINGS: Severe motion artifact is present and reduces diagnostic sensitivity and specificity. There is extensive diffuse subcutaneous edema  along the knees and lower legs bilaterally. On the left side, posterior soft tissue defect overlying the gastrocnemius musculature is noted in the proximal calf, tracking down to the mid calf region. Further distally, along the lateral lower calf and extending over a 10.6 cm excursion, there is a subcutaneous tissue defect in the left calf with irregular margins of the subcutaneous tissues and concern for overlying cutaneous ulceration. No drainable abscess is identified on either side. No discrete osteomyelitis is identified. I am doubtful of myositis given the appearance although  subtle myositis could be occult due to the severe degree of motion artifact. IMPRESSION: 1. Cutaneous and subcutaneous ulceration/defect in the distal lateral calf and in the proximal posterior calf, without underlying abscess or osteomyelitis identified. 2. No definite myositis although negative predictive value is reduced due to the severity of motion artifact. 3. Diffuse subcutaneous edema around both knees and both calves, nonspecific but possibly from venous insufficiency or cellulitis. Electronically Signed   By: Van Clines M.D.   On: 01/19/2015 17:25    Medications:  Scheduled: . amLODipine  10 mg Oral Daily  . cholecalciferol  5,000 Units Oral Daily  . cloNIDine  0.1 mg Oral Daily  . enoxaparin (LOVENOX) injection  30 mg Subcutaneous Q24H  . ferrous sulfate  325 mg Oral BID WC  . [START ON 01/25/2015] ferumoxytol  510 mg Intravenous Weekly  . labetalol  100 mg Oral BID  . latanoprost  1 drop Both Eyes QHS  . pantoprazole  40 mg Oral Daily  . piperacillin-tazobactam (ZOSYN)  IV  2.25 g Intravenous Q8H  . polyethylene glycol  17 g Oral Daily  . potassium chloride  20 mEq Oral Once  . sodium chloride  3 mL Intravenous Q12H  . vancomycin  500 mg Intravenous Q48H   Continuous: . sodium chloride 75 mL/hr at 01/20/15 2303   [id number] chloride, acetaminophen **OR** acetaminophen, diphenhydrAMINE, lipase/protease/amylase, ondansetron **OR** ondansetron (ZOFRAN) IV, oxyCODONE, polyvinyl alcohol, sodium chloride  Assessment/Plan:  Principal Problem:   Cellulitis of left lower extremity Active Problems:   Hypertension   Chronic kidney disease, stage 4, severely decreased GFR (HCC)   Urinary bladder cancer (HCC)   Pancreatic insufficiency (HCC)   Constipation   Abscess of left lower leg   Pressure ulcer    Cellulitis of left lower extremity No abscess noted on MRI. WBC noted to be higher today. Continue broad-spectrum antibiotics. Discussed with general surgery who  recommends consulting vascular surgery due to her chronic venous stasis. Discussed with Dr. [name] with vascular surgery. He recommended wound care consult. He is unable to do any kind of surgical debridement in this hospital. We will see what wound care suggests.  Acute on Chronic kidney disease, stage 4, severely decreased GFR with hypokalemia Patient's creatinine was 1.96 in August. Her BUN and creatinine are significantly worse compared to those values. She is followed by a nephrologist in [place]. She is noted to be on high-dose Lasix at home. She was given IV fluids. Chest x-ray this morning suggests pulmonary edema. She hasn't made much urine in the last 24 hours. We will give her dose of Lasix intravenously. May need to consider getting nephrology input. Patient does not appear to be a candidate for dialysis.  Essential Hypertension Continue labetalol 100 mg po bid.  History urinary bladder cancer with diversion conduit Continue supportive care.  Pancreatic insufficiency Continue pancreatic enzymes supplementation.  Constipation Continue Miralax and stool softener.  History of glaucoma Stable  Normocytic anemia Likely due  to chronic kidney disease. Hemoglobin is stable.  DVT Prophylaxis: Lovenox    Code Status: DO NOT RESUSCITATE  Family Communication: Discussed with her daughter  Disposition Plan: Continue management as outlined above. Patient lives at home and her family provides care.    LOS: 2 days   [facility] Pager [phone] 01/21/2015, 8:06 AM  If 7PM-7AM, please contact night-coverage at [email or web address], password [facility]

## 2015-01-22 LAB — URINE MICROSCOPIC-ADD ON

## 2015-01-22 LAB — BASIC METABOLIC PANEL
Anion gap: 15 (ref 5–15)
BUN: 111 mg/dL — ABNORMAL HIGH (ref 6–20)
CHLORIDE: 106 mmol/L (ref 101–111)
CO2: 18 mmol/L — AB (ref 22–32)
CREATININE: 4.85 mg/dL — AB (ref 0.44–1.00)
Calcium: 8 mg/dL — ABNORMAL LOW (ref 8.9–10.3)
GFR calc non Af Amer: 7 mL/min — ABNORMAL LOW (ref >60)
GFR, EST AFRICAN AMERICAN: 8 mL/min — AB (ref >60)
Glucose, Bld: 166 mg/dL — ABNORMAL HIGH (ref 65–99)
Potassium: 3 mmol/L — ABNORMAL LOW (ref 3.5–5.1)
Sodium: 139 mmol/L (ref 135–145)

## 2015-01-22 LAB — URINALYSIS, ROUTINE W REFLEX MICROSCOPIC
Bilirubin Urine: NEGATIVE
Glucose, UA: NEGATIVE mg/dL
Ketones, ur: NEGATIVE mg/dL
Leukocytes, UA: NEGATIVE
NITRITE: POSITIVE — AB
Protein, ur: >300 mg/dL — AB
SPECIFIC GRAVITY, URINE: 1.012 (ref 1.005–1.030)
pH: 7.5 (ref 5.0–8.0)

## 2015-01-22 LAB — CBC
HEMATOCRIT: 24.4 % — AB (ref 36.0–46.0)
HEMOGLOBIN: 8 g/dL — AB (ref 12.0–15.0)
MCH: 28.7 pg (ref 26.0–34.0)
MCHC: 32.8 g/dL (ref 30.0–36.0)
MCV: 87.5 fL (ref 78.0–100.0)
Platelets: 386 10*3/uL (ref 150–400)
RBC: 2.79 MIL/uL — ABNORMAL LOW (ref 3.87–5.11)
RDW: 14.6 % (ref 11.5–15.5)
WBC: 27.1 10*3/uL — ABNORMAL HIGH (ref 4.0–10.5)

## 2015-01-22 LAB — POCT HEMOGLOBIN-HEMACUE: Hemoglobin: 7.9 g/dL — ABNORMAL LOW (ref 12.0–15.0)

## 2015-01-22 MED ORDER — FUROSEMIDE 10 MG/ML IJ SOLN
120.0000 mg | Freq: Once | INTRAVENOUS | Status: AC
  Administered 2015-01-22: 120 mg via INTRAVENOUS
  Filled 2015-01-22: qty 10

## 2015-01-22 MED ORDER — POTASSIUM CHLORIDE CRYS ER 20 MEQ PO TBCR
20.0000 meq | EXTENDED_RELEASE_TABLET | Freq: Once | ORAL | Status: AC
  Administered 2015-01-22: 20 meq via ORAL
  Filled 2015-01-22: qty 1

## 2015-01-22 MED ORDER — MORPHINE SULFATE (PF) 2 MG/ML IV SOLN
0.5000 mg | INTRAVENOUS | Status: DC | PRN
  Administered 2015-01-22 – 2015-01-25 (×5): 0.5 mg via INTRAVENOUS
  Filled 2015-01-22 (×5): qty 1

## 2015-01-22 MED ORDER — DARBEPOETIN ALFA 150 MCG/0.3ML IJ SOSY
150.0000 ug | PREFILLED_SYRINGE | INTRAMUSCULAR | Status: DC
  Administered 2015-01-22: 150 ug via SUBCUTANEOUS
  Filled 2015-01-22: qty 0.3

## 2015-01-22 NOTE — Progress Notes (Signed)
01/22/15 1000 HYDROTHERAPY EVALUATION [phone]   Subjective Assessment  Subjective It has some loose skin there.  Patient and Family Stated Goals to get the wound healed(daughter)  Date of Onset (uncertain, present on admit)  Prior Treatments Unna boot/dressing change by [facility]  Evaluation and Treatment  Evaluation and Treatment Procedures Explained to Patient/Family Yes  Evaluation and Treatment Procedures agreed to  Wound / Incision (Open or Dehisced) 01/22/15 Other (Comment) Leg Left;Lateral open wound with yellow slough and necrotice tissue (black) arounf the lower border edges.  Date First Assessed/Time First Assessed: 01/22/15 0915   Wound Type: (c) Other (Comment)  Location: Leg  Location Orientation: Left;Lateral  Wound Description (Comments): open wound with yellow slough and necrotice tissue (black) arounf the lower bord...  Dressing Type ABD;Gauze (Comment);Moist to dry (santyl)  Dressing Changed New  Dressing Status Clean;Dry;Intact  Dressing Change Frequency Daily  Site / Wound Assessment Painful;Yellow  % Wound base Red or Granulating 0%  % Wound base Yellow 100%  Peri-wound Assessment Edema;Excoriated;Maceration  Wound Length (cm) 11 cm  Wound Width (cm) 4 cm  Wound Depth (cm) 0.5 cm  Undermining (cm) 5 at 3 oclock, 3 at 6 oclock, undermine from 3 to 9 oclock  Margins Unattached edges (unapproximated)  Drainage Amount Moderate  Drainage Description No odor;Serosanguineous  Non-staged Wound Description Full thickness  Treatment Hydrotherapy (Pulse lavage);Packing (Saline gauze) (santyl)  Hydrotherapy  Pulsed lavage therapy - wound location lateral left leg  Pulsed Lavage with Suction (psi) 8 psi  Pulsed Lavage with Suction - Normal Saline Used 1000 mL  Pulsed Lavage Tip Tip with splash shield  Wound Therapy - Assess/Plan/Recommendations  Wound Therapy - Clinical Statement Elderly female with open draining wound on the lateral L leg that was present on admission.In  bed, patient appears to position both legs in  rotation ( L in external rotation  with potential for pressure to that area.). Per daughter, patient has not ambulated for several weeks, has been bed bound.  The  patient expresses that the  wound is painful with pressure applied.  Per chart, no surgical debridement  planned. Daughter asked  questions regarding the treatment, questions addressed. Plan for 6 x week PLS.Marland Kitchen Premedication was beneficial for patient tolerated well with several breaks in the PLS.Marland Kitchen Patient will benefit from Wound care while an inpatient to address goals below.  Wound Therapy - Functional Problem List non ambulatory  Factors Delaying/Impairing Wound Healing Immobility;Multiple medical problems  Hydrotherapy Plan Debridement;Dressing change;Patient/family education;Pulsatile lavage with suction  Wound Therapy - Frequency 6X / week  Wound Therapy - Current Recommendations Case manager/social work;WOC nurse  Wound Therapy - Follow Up Recommendations Skilled nursing facility (unless family desires home.)  Wound Plan 6 x week for  PLS and dressing change  Wound Therapy Goals - Improve the function of patient's integumentary system by progressing the wound(s) through the phases of wound healing by:  Decrease Necrotic Tissue to 50  Decrease Necrotic Tissue - Progress Goal set today  Increase Granulation Tissue to 50  Increase Granulation Tissue - Progress Goal set today  Decrease Length/Width/Depth by (cm) .5  Decrease Length/Width/Depth - Progress Goal set today  Improve Drainage Characteristics Min;Serous  Improve Drainage Characteristics - Progress Goal set today  Patient/Family will be able to  reposition for pressure relief.  Patient/Family Instruction Goal - Progress Goal set today  Goals/treatment plan/discharge plan were made with and agreed upon by patient/family Yes  Time For Goal Achievement 2 weeks  Wound Therapy - Potential  for Goals Apolonio Schneiders  PT [phone]

## 2015-01-22 NOTE — Progress Notes (Signed)
[facility] PROGRESS NOTE  [name] [id number] DOB: [birth year] DOA: 01/19/2015  PCP: [name], MD  Brief HPI: 82 year old African-American female, past medical history of hypertension, presented with the complaints of worsening lower extremity edema and worsening left lower extremity wound with purulent drainage. Patient was hospitalized for further management. She was seen by wound care. She also developed pulmonary edema.  Past medical history:  Past Medical History  Diagnosis Date  . Hypertension   . Shingles   . History of bladder cancer 1970's    Consultants: Nephrology. Palliative Medicine.  Medicine  Procedures: None  Antibiotics: Vancomycin and Zosyn  Subjective: Patient states that her breathing is about the same. Denies any chest pain. Pain in the left leg is also stable. Her daughter is at the bedside.   Objective: Vital Signs  Filed Vitals:   01/21/15 0502 01/21/15 1345 01/21/15 2038 01/22/15 0701  BP: 154/60 146/49 159/47 156/51  Pulse: 82 66 84 80  Temp: 98.4 F (36.9 C) 98 F (36.7 C) 99.8 F (37.7 C) 99.3 F (37.4 C)  TempSrc: Oral Axillary Oral Oral  Resp: 16 16 18 18   Height:      Weight:      SpO2: 91% 91% 91% 97%    Intake/Output Summary (Last 24 hours) at 01/22/15 0829 Last data filed at 01/22/15 0700  Gross per 24 hour  Intake    400 ml  Output   1300 ml  Net   -900 ml   Filed Weights   01/19/15 1420 01/19/15 2110  Weight: 58.06 kg (128 lb) 55.929 kg (123 lb 4.8 oz)    General appearance: alert, distracted and no distress Resp: Crackles heard bilateral bases.  Cardio: regular rate and rhythm, S1, S2 normal, no murmur, click, rub or gallop GI: soft, non-tender; bowel sounds normal; no masses,  no organomegaly. Diverting conduit noted. Extremities: Left lower extremity revealed Ulcerated area in the lateral aspect of the left leg. Measuring about 8 cm in length. Yellowish exudate is noted. Some warmth is  present. Swelling is present bilateral lower extremities. Neurologic: Awake, alert, distracted. Moving all her extremities. No focal deficits.  Lab Results:  Basic Metabolic Panel:  Recent Labs Lab 01/19/15 1814 01/20/15 0450 01/21/15 0500 01/22/15 0430  NA 133* 135 139 139  K 3.5 3.3* 3.3* 3.0*  CL 99* 102 105 106  CO2 18* 19* 19* 18*  GLUCOSE 159* 156* 121* 166*  BUN 115* 112* 104* 111*  CREATININE 4.54* 4.34* 4.52* 4.85*  CALCIUM 8.0* 7.9* 8.1* 8.0*  MG 1.3*  --   --   --   PHOS 4.7*  --   --   --    Liver Function Tests:  Recent Labs Lab 01/19/15 1814 01/20/15 0450  AST 31 27  ALT 30 24  ALKPHOS 81 68  BILITOT 0.8 0.8  PROT 6.6 5.8*  ALBUMIN 2.5* 2.0*   CBC:  Recent Labs Lab 01/19/15 1608 01/20/15 0450 01/21/15 0500 01/22/15 0430  WBC 21.7* 24.5* 31.2* 27.1*  NEUTROABS 18.9* 21.8*  --   --   HGB 7.9* 7.3* 8.3* 8.0*  HCT 23.2* 22.4* 25.7* 24.4*  MCV 86.6 86.2 86.5 87.5  PLT 320 313 390 386   BNP (last 3 results)  Recent Labs  08/30/14 1928 01/19/15 1608  BNP 395.6* 235.8*     Studies/Results: Dg Chest Port 1 View  01/21/2015  CLINICAL DATA:  Shortness of breath and hypoxia EXAM: PORTABLE CHEST 1 VIEW COMPARISON:  08/30/2014  FINDINGS: 0834 hours. Bilateral interstitial and asymmetric airspace disease noted, right greater than left. Small to moderate bilateral pleural effusions are associated. The cardio pericardial silhouette is enlarged. Bones are diffusely demineralized. IMPRESSION: Asymmetric pulmonary edema versus diffuse infection. Small the moderate bilateral pleural effusions. Electronically Signed   By: Misty Stanley M.D.   On: 01/21/2015 09:34    Medications:  Scheduled: . amLODipine  10 mg Oral Daily  . cholecalciferol  5,000 Units Oral Daily  . cloNIDine  0.1 mg Oral Daily  . collagenase   Topical Daily  . enoxaparin (LOVENOX) injection  30 mg Subcutaneous Q24H  . ferrous sulfate  325 mg Oral BID WC  . [START ON 01/25/2015]  ferumoxytol  510 mg Intravenous Weekly  . furosemide  120 mg Intravenous Once  . labetalol  100 mg Oral BID  . latanoprost  1 drop Both Eyes QHS  . pantoprazole  40 mg Oral Daily  . piperacillin-tazobactam (ZOSYN)  IV  2.25 g Intravenous Q8H  . polyethylene glycol  17 g Oral Daily  . potassium chloride  20 mEq Oral Once  . sodium chloride  3 mL Intravenous Q12H  . vancomycin  500 mg Intravenous Q48H   Continuous: . sodium chloride 50 mL/hr at 01/21/15 1717   [id number] chloride, acetaminophen **OR** acetaminophen, diphenhydrAMINE, lipase/protease/amylase, morphine injection, ondansetron **OR** ondansetron (ZOFRAN) IV, oxyCODONE, polyvinyl alcohol, sodium chloride  Assessment/Plan:  Principal Problem:   Cellulitis of left lower extremity Active Problems:   Hypertension   Chronic kidney disease, stage 4, severely decreased GFR (HCC)   Urinary bladder cancer (HCC)   Pancreatic insufficiency (HCC)   Constipation   Abscess of left lower leg   Pressure ulcer    Cellulitis of left lower extremity No abscess noted on MRI. WBC noted to be high. Continue broad-spectrum antibiotics. Discussed with general surgery who recommended consulting vascular surgery due to her chronic venous stasis. Discussed with Dr. [name] with vascular surgery. He recommended wound care consult. He is unable to do any kind of surgical debridement in this hospital. Continue hydrotherapy. Patient is not a candidate for aggressive surgical intervention.   Acute on Chronic kidney disease, stage 4, severely decreased GFR with hypokalemia Patient's creatinine was 1.96 in August. Her BUN and creatinine are significantly worse compared to those values. She is followed by a nephrologist in [place] (Dr. [name]). She is noted to be on high-dose Lasix at home. She was given IV fluids during earlier part of this hospitalization. Chest x-ray on 1/2 suggested pulmonary edema. She was given high dose Lasix. Urine output did pick up  some. Patient continues to have crackles in the lungs. We will give her high-dose of intravenous Lasix. Discussed with Dr. Posey Pronto with nephrology who will consult on this patient. Discussed her poor long-term prognosis with the daughter. She had tells me that she's had a conversation with patient's nephrologist in the past. She knows that patient is not a dialysis candidate. She is also aware that if she does not improve, hospice may have to be considered. We will also involve palliative medicine so that this can be facilitated if indicated.  Pulmonary edema Likely due to renal failure. See above. Additional dose of Lasix this morning. Nephrology to weigh in as well. Primary goal is to keep her comfortable.  Essential Hypertension Continue labetalol 100 mg po bid.  History urinary bladder cancer with diversion conduit Continue supportive care.  Pancreatic insufficiency Continue pancreatic enzymes supplementation.  Constipation Continue Miralax and stool softener.  History  of glaucoma Stable  Normocytic anemia Likely due to chronic kidney disease. Hemoglobin is stable.  DVT Prophylaxis: Lovenox    Code Status: DO NOT RESUSCITATE  Family Communication: Discussed with her daughter  Disposition Plan: Continue management as outlined above. Patient lives at home and her family provides care.    LOS: 3 days   [facility] Pager [phone] 01/22/2015, 8:29 AM  If 7PM-7AM, please contact night-coverage at [email or web address], password [facility]

## 2015-01-22 NOTE — Consult Note (Addendum)
Reason for Consult: Acute renal failure on chronic kidney disease stage IV Referring Physician: Bonnielee Haff M.D. [facility])   HPI:  82 year old African-American woman with past medical history significant for hypertension, history of bladder cancer status post cystectomy with ileostomy diversion. She also has a history of varicose eczematous changes of the lower extremity with chronic lower extremity edema. She was admitted with worsening edema and a left lower extremity wound with purulent drainage. She was started on intravenous antibiotics and seen by wound care for this. Unfortunately, she has also developed features of hypoxia from pulmonary edema requiring escalation of diuretic therapy. Concern is raised with her rising BUN/creatinine as follows:   09/04/2014  01/19/2015  01/20/2015  01/21/2015  01/22/2015   BUN 38 (H) 115 (H) 112 (H) 104 (H) 111 (H)  Creatinine 1.96 (H) 4.54 (H) 4.34 (H) 4.52 (H) 4.85 (H)   From her previous records from her visit with Dr. [name] at [facility], she has had progressive rise of creatinine as follows: January 2015 1.8, July 2015 2.3, January 2016 2.1, April 2016 2.5, August 2016 2.5 and November 2016 3.5. Her losartan was discontinued and antihypertensive therapy adjusted to replace this. She has had discussions with Dr. [name] and concluded that dialysis was not an option.  Past Medical History  Diagnosis Date  . Hypertension   . Shingles   . History of bladder cancer 1970's    Past Surgical History  Procedure Laterality Date  . Cystectomy w/ continent diversion Left     Family History  Problem Relation Age of Onset  . Family history unknown: Yes    Social History:  reports that she has never smoked. She has never used smokeless tobacco. She reports that she does not drink alcohol or use illicit drugs.  Allergies: No Known Allergies  Medications:  Scheduled: . amLODipine  10 mg Oral Daily  . cholecalciferol  5,000 Units Oral Daily  . cloNIDine  0.1  mg Oral Daily  . collagenase   Topical Daily  . enoxaparin (LOVENOX) injection  30 mg Subcutaneous Q24H  . ferrous sulfate  325 mg Oral BID WC  . [START ON 01/25/2015] ferumoxytol  510 mg Intravenous Weekly  . labetalol  100 mg Oral BID  . latanoprost  1 drop Both Eyes QHS  . pantoprazole  40 mg Oral Daily  . piperacillin-tazobactam (ZOSYN)  IV  2.25 g Intravenous Q8H  . polyethylene glycol  17 g Oral Daily  . sodium chloride  3 mL Intravenous Q12H  . vancomycin  500 mg Intravenous Q48H    BMP Latest Ref Rng 01/22/2015 01/21/2015 01/20/2015  Glucose 65 - 99 mg/dL 166(H) 121(H) 156(H)  BUN 6 - 20 mg/dL 111(H) 104(H) 112(H)  Creatinine 0.44 - 1.00 mg/dL 4.85(H) 4.52(H) 4.34(H)  Sodium 135 - 145 mmol/L 139 139 135  Potassium 3.5 - 5.1 mmol/L 3.0(L) 3.3(L) 3.3(L)  Chloride 101 - 111 mmol/L 106 105 102  CO2 22 - 32 mmol/L 18(L) 19(L) 19(L)  Calcium 8.9 - 10.3 mg/dL 8.0(L) 8.1(L) 7.9(L)    CBC Latest Ref Rng 01/22/2015 01/21/2015 01/20/2015  WBC 4.0 - 10.5 K/uL 27.1(H) 31.2(H) 24.5(H)  Hemoglobin 12.0 - 15.0 g/dL 8.0(L) 8.3(L) 7.3(L)  Hematocrit 36.0 - 46.0 % 24.4(L) 25.7(L) 22.4(L)  Platelets 150 - 400 K/uL 386 390 313      Dg Chest Port 1 View  01/21/2015  CLINICAL DATA:  Shortness of breath and hypoxia EXAM: PORTABLE CHEST 1 VIEW COMPARISON:  08/30/2014 FINDINGS: 0834 hours. Bilateral interstitial  and asymmetric airspace disease noted, right greater than left. Small to moderate bilateral pleural effusions are associated. The cardio pericardial silhouette is enlarged. Bones are diffusely demineralized. IMPRESSION: Asymmetric pulmonary edema versus diffuse infection. Small the moderate bilateral pleural effusions. Electronically Signed   By: Misty Stanley M.D.   On: 01/21/2015 09:34    Review of Systems  Constitutional: Positive for malaise/fatigue. Negative for fever and chills.  HENT: Negative.   Eyes: Negative.   Respiratory: Negative for cough, hemoptysis, sputum production and shortness of  breath.   Cardiovascular: Positive for orthopnea and leg swelling. Negative for chest pain.  Gastrointestinal: Negative.   Genitourinary: Negative.   Musculoskeletal:       Reports pain in left leg greater than right leg  Skin: Negative.   Neurological: Positive for weakness.   Blood pressure 156/51, pulse 80, temperature 99.3 F (37.4 C), temperature source Oral, resp. rate 18, height 5' (1.524 m), weight 54.7 kg (120 lb 9.5 oz), SpO2 90 %. Physical Exam  Nursing note and vitals reviewed. Constitutional: She appears well-developed and well-nourished. No distress.  HENT:  Head: Normocephalic and atraumatic.  Nose: Nose normal.  Mouth/Throat: Oropharynx is clear and moist. No oropharyngeal exudate.  Eyes: EOM are normal. Pupils are equal, round, and reactive to light. No scleral icterus.  Neck: Normal range of motion. Neck supple. No JVD present. No thyromegaly present.  Cardiovascular: Normal rate, regular rhythm and normal heart sounds.   No murmur heard. Respiratory: Effort normal. She has rales.  GI: Soft. Bowel sounds are normal. She exhibits no distension. There is no tenderness.  Musculoskeletal: She exhibits tenderness.  Left leg wrapped in clean gauze dressing. Right leg in an Ace wrap. Tenderness elicited from left leg  Neurological: She is alert.  Skin: Skin is warm and dry. No erythema.  Psychiatric: She has a normal mood and affect.    Assessment/Plan: 1. Acute renal failure on chronic kidney disease stage IV: This might be hemodynamically mediated acute renal failure superimposed on her chronic kidney disease versus progression of her chronic kidney disease itself given the available data. At this time, I agree with diuretic therapy to treat her pulmonary edema/hypoxia while monitoring daily labs/electrolytes. I again impressed upon her that with her advanced age as well as baseline functional limitations, we would not be undertaking any dialysis. I will check a  urinalysis and urine culture given her propensity for urinary tract infections in the past and the chance that this might be associated with a pyelonephritis (at this point, partially treated by vancomycin). If renal function continues to deteriorate, I agree with palliative care/hospice consultation. I will discontinue saline drip. 2. Left lower extremity cellulitis: Ongoing wound care and currently on intravenous vancomycin and Zosyn-low random vancomycin level of 10 noted on lab yesterday. 3. Anemia: Scheduled to get intravenous Feraheme and I will re-dose Aranesp. Suspect that infection may be leading to an inflammatory component/resistance to ESA. 4. Hypertension: On previously prescribed antihypertensive therapy and currently escalation of diuretic therapy to help alleviate pulmonary edema. We'll continue to monitor. 5. Hypokalemia: Secondary to diuretic therapy and oral replacement ordered this morning by Dr. [name]. Will check potassium and magnesium levels with labs tomorrow morning. 6. Hypoxia/pulmonary edema: Discontinue saline drip and continue furosemide bolus.  Fabian Walder K. 01/22/2015, 12:00 PM

## 2015-01-22 NOTE — Consult Note (Signed)
WOC wound follow up Wound type: Vasculitic ulceration on LLE wound with nonviable tissue.  Consultation with PT performing hydrotherapy today Due to positioning when patient is in the supine position, will provide a pressure redistribution boot in an attempt to offload affected area. [place] nursing team will follow intermittently for ostomy care and wound support, remaining available to this patient, the nursing and medical teams.   Thanks, Maudie Flakes, MSN, RN, [place], Arther Abbott  Pager# [phone]

## 2015-01-22 NOTE — Care Management Important Message (Signed)
Important Message  Patient Details  Name: [name] MRN: [id number] Date of Birth: 10/28/1919   Medicare Important Message Given:  Yes    Camillo Flaming 01/22/2015, 2:08 [place] Message  Patient Details  Name: [name] MRN: [id number] Date of Birth: February 06, 1919   Medicare Important Message Given:  Yes    Camillo Flaming 01/22/2015, 2:08 PM

## 2015-01-23 DIAGNOSIS — L8990 Pressure ulcer of unspecified site, unspecified stage: Secondary | ICD-10-CM

## 2015-01-23 LAB — BASIC METABOLIC PANEL
Anion gap: 15 (ref 5–15)
BUN: 112 mg/dL — AB (ref 6–20)
CALCIUM: 7.6 mg/dL — AB (ref 8.9–10.3)
CO2: 20 mmol/L — AB (ref 22–32)
CREATININE: 4.64 mg/dL — AB (ref 0.44–1.00)
Chloride: 105 mmol/L (ref 101–111)
GFR calc Af Amer: 9 mL/min — ABNORMAL LOW (ref >60)
GFR calc non Af Amer: 7 mL/min — ABNORMAL LOW (ref >60)
GLUCOSE: 145 mg/dL — AB (ref 65–99)
Potassium: 3 mmol/L — ABNORMAL LOW (ref 3.5–5.1)
Sodium: 140 mmol/L (ref 135–145)

## 2015-01-23 LAB — CBC
HEMATOCRIT: 24.5 % — AB (ref 36.0–46.0)
Hemoglobin: 7.9 g/dL — ABNORMAL LOW (ref 12.0–15.0)
MCH: 28 pg (ref 26.0–34.0)
MCHC: 32.2 g/dL (ref 30.0–36.0)
MCV: 86.9 fL (ref 78.0–100.0)
Platelets: 357 10*3/uL (ref 150–400)
RBC: 2.82 MIL/uL — ABNORMAL LOW (ref 3.87–5.11)
RDW: 14.8 % (ref 11.5–15.5)
WBC: 23.7 10*3/uL — ABNORMAL HIGH (ref 4.0–10.5)

## 2015-01-23 LAB — URINE CULTURE: Culture: 8000

## 2015-01-23 LAB — VANCOMYCIN, TROUGH: Vancomycin Tr: 14 ug/mL (ref 10.0–20.0)

## 2015-01-23 LAB — MAGNESIUM: Magnesium: 1.2 mg/dL — ABNORMAL LOW (ref 1.7–2.4)

## 2015-01-23 MED ORDER — FUROSEMIDE 40 MG PO TABS
80.0000 mg | ORAL_TABLET | Freq: Two times a day (BID) | ORAL | Status: DC
Start: 2015-01-23 — End: 2015-01-25
  Administered 2015-01-23 – 2015-01-25 (×4): 80 mg via ORAL
  Filled 2015-01-23 (×4): qty 2

## 2015-01-23 MED ORDER — POTASSIUM CHLORIDE CRYS ER 20 MEQ PO TBCR
60.0000 meq | EXTENDED_RELEASE_TABLET | Freq: Once | ORAL | Status: AC
  Administered 2015-01-23: 60 meq via ORAL
  Filled 2015-01-23: qty 3

## 2015-01-23 MED ORDER — MAGNESIUM SULFATE 2 GM/50ML IV SOLN
2.0000 g | Freq: Once | INTRAVENOUS | Status: AC
  Administered 2015-01-23: 2 g via INTRAVENOUS
  Filled 2015-01-23: qty 50

## 2015-01-23 NOTE — Progress Notes (Signed)
   01/23/15 1100 Hydrotherapy treatment  Subjective Assessment  Subjective Daughter asks: that is going to heal, isn't it?  Patient and Family Stated Goals to get the wound healed(daughter)  Evaluation and Treatment  Evaluation and Treatment Procedures Explained to Patient/Family Yes  Evaluation and Treatment Procedures agreed to  Wound / Incision (Open or Dehisced) 01/22/15 Other (Comment) Leg Left;Lateral open wound with yellow slough and necrotice tissue (black) arounf the lower border edges.  Date First Assessed/Time First Assessed: 01/22/15 0915   Wound Type: (c) Other (Comment)  Location: Leg  Location Orientation: Left;Lateral  Wound Description (Comments): open wound with yellow slough and necrotice tissue (black) arounf the lower bord...  Dressing Type ABD;Gauze (Comment);Moist to dry (santyl)  Dressing Changed New  Dressing Status Clean;Dry;Intact  Dressing Change Frequency Daily  Site / Wound Assessment Clean  % Wound base Red or Granulating 10%  % Wound base Yellow 90% (stringy slough)  Peri-wound Assessment Edema  Wound Length (cm) 11 cm  Wound Width (cm) 4 cm  Wound Depth (cm) 0.5 cm  Undermining (cm) 1-5  cm around the edges, depest is at 6 oclock  Margins Unattached edges (unapproximated)  Drainage Amount Moderate  Drainage Description Green;Serosanguineous  Non-staged Wound Description Full thickness  Treatment Hydrotherapy (Pulse lavage);Packing (Saline gauze)  Hydrotherapy  Pulsed lavage therapy - wound location lateral left leg  Pulsed Lavage with Suction (psi) 8 psi  Pulsed Lavage with Suction - Normal Saline Used 1000 mL  Pulsed Lavage Tip Tip with splash shield  Wound Therapy - Assess/Plan/Recommendations  Wound Therapy - Clinical Statement the  wound at 6 oclock is soft, appears slough in the undermining area., difficult to remove stringy slough with forceps. The wound is tender  at  the distal end.   Wound Therapy - Functional Problem List non ambulatory   Factors Delaying/Impairing Wound Healing Immobility;Multiple medical problems  Hydrotherapy Plan Debridement;Dressing change;Patient/family education;Pulsatile lavage with suction  Wound Therapy - Frequency 6X / week  Wound Therapy - Current Recommendations Case manager/social work;WOC nurse  Wound Therapy - Follow Up Recommendations Skilled nursing facility (unless family wants to return home.)  Wound Therapy Goals - Improve the function of patient's integumentary system by progressing the wound(s) through the phases of wound healing by:  Decrease Necrotic Tissue to 50  Decrease Necrotic Tissue - Progress Progressing toward goal  Increase Granulation Tissue to 50  Increase Granulation Tissue - Progress Progressing toward goal  Decrease Length/Width/Depth by (cm) .5  Decrease Length/Width/Depth - Progress Progressing toward goal  Improve Drainage Characteristics Min;Serous  Improve Drainage Characteristics - Progress Progressing toward goal  Patient/Family will be able to  reposition for pressure relief.  Patient/Family Instruction Goal - Progress Progressing toward goal  Goals/treatment plan/discharge plan were made with and agreed upon by patient/family Yes  Time For Goal Achievement 2 weeks  Wound Therapy - Potential for Goals Apolonio Schneiders PT [phone]

## 2015-01-23 NOTE — Progress Notes (Signed)
Patient ID: [name], female   DOB: 10/31/1919, 82 y.o.   MRN: [id number]  [facility] Progress Note   Assessment/ Plan:   1. Acute renal failure on chronic kidney disease stage IV: suspected hemodynamically mediated AKI on CKD from cellulitis/SIRS. Renal function appears rather unchanged overnight and there are no acute needs identified for intervention at this time. She is not a candidate for any form of RRT.  2. Left lower extremity cellulitis: Ongoing wound care and currently on intravenous vancomycin and Zosyn- vancomycin levels non-toxic. 3. Anemia: Scheduled to get intravenous Feraheme and I will re-dose Aranesp. Suspect that infection may be leading to an inflammatory component/resistance to ESA. 4. Hypertension: On previously prescribed antihypertensive therapy and currently escalation of diuretic therapy to help alleviate pulmonary edema. We'll continue to monitor. 5. Hypokalemia: Secondary to diuretic therapy and oral replacement ordered this morning by Dr. Wyline Copas. Will check potassium and magnesium levels with labs tomorrow morning. 6. Hypoxia/pulmonary edema: Restart scheduled furosemide after prior boluses.  Subjective:   Tired and thirsty this morning and endorses some shortness of breath.   Objective:   BP 157/50 mmHg  Pulse 85  Temp(Src) 99.9 F (37.7 C) (Oral)  Resp 18  Ht 5' (1.524 m)  Wt 54.7 kg (120 lb 9.5 oz)  BMI 23.55 kg/m2  SpO2 97%  Physical Exam: [id number] resting in bed [id number] RRR, ESM over apex Resp:coarse BS bilaterally- no distinct rhonchi [id number], flat, NT [id number] LE edema  Labs: BMET  Recent Labs Lab 01/19/15 1814 01/20/15 0450 01/21/15 0500 01/22/15 0430 01/23/15 0420  NA 133* 135 139 139 140  K 3.5 3.3* 3.3* 3.0* 3.0*  CL 99* 102 105 106 105  CO2 18* 19* 19* 18* 20*  GLUCOSE 159* 156* 121* 166* 145*  BUN 115* 112* 104* 111* 112*  CREATININE 4.54* 4.34* 4.52* 4.85* 4.64*  CALCIUM 8.0* 7.9* 8.1* 8.0*  7.6*  PHOS 4.7*  --   --   --   --    CBC  Recent Labs Lab 01/19/15 1608 01/20/15 0450 01/21/15 0500 01/22/15 0430 01/23/15 0420  WBC 21.7* 24.5* 31.2* 27.1* 23.7*  NEUTROABS 18.9* 21.8*  --   --   --   HGB 7.9* 7.3* 8.3* 8.0* 7.9*  HCT 23.2* 22.4* 25.7* 24.4* 24.5*  MCV 86.6 86.2 86.5 87.5 86.9  PLT 320 313 390 386 357   Medications:    . amLODipine  10 mg Oral Daily  . cholecalciferol  5,000 Units Oral Daily  . cloNIDine  0.1 mg Oral Daily  . collagenase   Topical Daily  . darbepoetin (ARANESP) injection - NON-DIALYSIS  150 mcg Subcutaneous Q Tue-1800  . enoxaparin (LOVENOX) injection  30 mg Subcutaneous Q24H  . ferrous sulfate  325 mg Oral BID WC  . [START ON 01/25/2015] ferumoxytol  510 mg Intravenous Weekly  . labetalol  100 mg Oral BID  . latanoprost  1 drop Both Eyes QHS  . pantoprazole  40 mg Oral Daily  . piperacillin-tazobactam (ZOSYN)  IV  2.25 g Intravenous Q8H  . polyethylene glycol  17 g Oral Daily  . sodium chloride  3 mL Intravenous Q12H  . vancomycin  500 mg Intravenous Q48H   Elmarie Shiley, MD 01/23/2015, 11:44 AM

## 2015-01-23 NOTE — Progress Notes (Signed)
Pharmacy Antibiotic Follow-up Note  [name] is a 82 y.o. year-old female admitted on 01/19/2015.  The patient is currently on day # 5 of vancomycin and zosyn for LLE wound infection with purulent drainage.  Assessment/Plan: This patient's current antibiotics will be continued without adjustments.  Temp (24hrs), Avg:99.2 F (37.3 C), Min:97.9 F (36.6 C), Max:99.9 F (37.7 C)   Recent Labs Lab 01/19/15 1608 01/20/15 0450 01/21/15 0500 01/22/15 0430 01/23/15 0420  WBC 21.7* 24.5* 31.2* 27.1* 23.7*    Recent Labs Lab 01/19/15 1814 01/20/15 0450 01/21/15 0500 01/22/15 0430 01/23/15 0420  CREATININE 4.54* 4.34* 4.52* 4.85* 4.64*   Estimated Creatinine Clearance: 5.9 mL/min (by C-G formula based on Cr of 4.64).    No Known Allergies  Antimicrobials this admission: 12/31 >> vanc>> 12/31 >> zosyn >>  Levels/dose changes this admission: 1/2 1200 Random Vanc = 10 with last dose 1g 12/31 at 1736 - continue 500mg  q48 1/4 1330 Vanc = 14 (~48 hours from 500 mg dose), continue 500 mg q48h  Microbiology results: 12/31 blood: ngtd 1/3 urine: too young to read  Thank you for allowing pharmacy to be a part of this patient's care.  Hershal Coria PharmD 01/23/2015 2:27 PM

## 2015-01-23 NOTE — Progress Notes (Signed)
[facility] PROGRESS NOTE  [name] [id number] DOB: 15-Feb-1919 DOA: 01/19/2015 PCP: [name], MD  HPI/Brief narrative 82 year old African-American female, past medical history of hypertension, presented with the complaints of worsening lower extremity edema and worsening left lower extremity wound with purulent drainage. Patient was hospitalized for further management. She was seen by wound care. She also developed pulmonary edema.  Assessment/Plan: Cellulitis of left lower extremity -No abscess seen on MRI.  -Presented with leukocytosis.  -For now, pt is continued on broad-spectrum antibiotics.  -Dr. [name] had discussed with general surgery who recommended consulting vascular surgery due to her chronic venous stasis. Dr. [name] discussed with Dr. [name] with vascular surgery who recommended wound care consult. She is unable to do any kind of surgical debridement in this hospital.  -Continue hydrotherapy.  -Patient is not a candidate for aggressive surgical intervention.   Acute on Chronic kidney disease, stage 4, severely decreased GFR with hypokalemia -Patient's creatinine was 1.96 in August. Her BUN and creatinine are significantly worse compared to those values. -She is followed by a nephrologist in [place] (Dr. [name]).  -She is noted to be on high-dose Lasix at home.  -She was given IV fluids during earlier part of this hospitalization. As chest x-ray on 1/2 suggested pulmonary edema.  -Nephrology was consulted. -Overall poor long-term prognosis.  -Appreciate input by Palliative Care. Patient would be good candidate for hospice services  Pulmonary edema -Likely due to renal failure. -Primary goal is to keep her comfortable.  Essential Hypertension -Continue labetalol 100 mg po bid. -BP stable thus far  History urinary bladder cancer with diversion conduit -Continue supportive care.  Pancreatic insufficiency -Continue pancreatic enzymes  supplementation.  Constipation -Continue Miralax and stool softener.  History of glaucoma -Stable  Normocytic anemia -Likely due to chronic kidney disease. Hemoglobin is stable.  Code Status: DNR Family Communication: Pt in room Disposition Plan: Possible home with hospice? Pending final recs by Palliative Care. Also when OK with Nephrology   Consultants:  Palliative Care  Nephrology  Procedures:    Antibiotics: Anti-infectives    Start     Dose/Rate Route Frequency Ordered Stop   01/21/15 1400  vancomycin (VANCOCIN) 500 mg in sodium chloride 0.9 % 100 mL IVPB     500 mg 100 mL/hr over 60 Minutes Intravenous Every 48 hours 01/19/15 1956     01/19/15 2200  piperacillin-tazobactam (ZOSYN) IVPB 2.25 g     2.25 g 100 mL/hr over 30 Minutes Intravenous Every 8 hours 01/19/15 2136     01/19/15 1600  vancomycin (VANCOCIN) IVPB 1000 mg/200 mL premix     1,000 mg 200 mL/hr over 60 Minutes Intravenous  Once 01/19/15 1558 01/19/15 1836      HPI/Subjective: No complaints to me  Objective: Filed Vitals:   01/22/15 2136 01/23/15 0630 01/23/15 0700 01/23/15 1334  BP: 169/54  157/50 147/46  Pulse: 86  85 72  Temp: 99.7 F (37.6 C)  99.9 F (37.7 C) 97.9 F (36.6 C)  TempSrc: Oral  Oral Oral  Resp: 18  18 16   Height:      Weight:      SpO2: 94% 95% 97% 96%    Intake/Output Summary (Last 24 hours) at 01/23/15 1737 Last data filed at 01/23/15 1635  Gross per 24 hour  Intake    273 ml  Output    550 ml  Net   -277 ml   Filed Weights   01/19/15 1420 01/19/15 2110 01/22/15 0850  Weight: 58.06  kg (128 lb) 55.929 kg (123 lb 4.8 oz) 54.7 kg (120 lb 9.5 oz)    Exam:   General:  Awake, in nad  Cardiovascular: regular, s1, s2  Respiratory: normal resp effort, no wheezing  Abdomen: soft,nondistended  Musculoskeletal: perfused, no clubbing   Data Reviewed: Basic Metabolic Panel:  Recent Labs Lab 01/19/15 1814 01/20/15 0450 01/21/15 0500 01/22/15 0430  01/23/15 0420  NA 133* 135 139 139 140  K 3.5 3.3* 3.3* 3.0* 3.0*  CL 99* 102 105 106 105  CO2 18* 19* 19* 18* 20*  GLUCOSE 159* 156* 121* 166* 145*  BUN 115* 112* 104* 111* 112*  CREATININE 4.54* 4.34* 4.52* 4.85* 4.64*  CALCIUM 8.0* 7.9* 8.1* 8.0* 7.6*  MG 1.3*  --   --   --  1.2*  PHOS 4.7*  --   --   --   --    Liver Function Tests:  Recent Labs Lab 01/19/15 1814 01/20/15 0450  AST 31 27  ALT 30 24  ALKPHOS 81 68  BILITOT 0.8 0.8  PROT 6.6 5.8*  ALBUMIN 2.5* 2.0*   No results for input(s): LIPASE, AMYLASE in the last 168 hours. No results for input(s): AMMONIA in the last 168 hours. CBC:  Recent Labs Lab 01/19/15 1608 01/20/15 0450 01/21/15 0500 01/22/15 0430 01/23/15 0420  WBC 21.7* 24.5* 31.2* 27.1* 23.7*  NEUTROABS 18.9* 21.8*  --   --   --   HGB 7.9* 7.3* 8.3* 8.0* 7.9*  HCT 23.2* 22.4* 25.7* 24.4* 24.5*  MCV 86.6 86.2 86.5 87.5 86.9  PLT 320 313 390 386 357   Cardiac Enzymes: No results for input(s): CKTOTAL, CKMB, CKMBINDEX, TROPONINI in the last 168 hours. BNP (last 3 results)  Recent Labs  08/30/14 1928 01/19/15 1608  BNP 395.6* 235.8*    ProBNP (last 3 results) No results for input(s): PROBNP in the last 8760 hours.  CBG: No results for input(s): GLUCAP in the last 168 hours.  Recent Results (from the past 240 hour(s))  Blood culture (routine x 2)     Status: None (Preliminary result)   Collection Time: 01/19/15  4:08 PM  Result Value Ref Range Status   Specimen Description LEFT ANTECUBITAL  Final   Special Requests BOTTLES DRAWN AEROBIC AND ANAEROBIC 5CC  Final   Culture   Final    NO GROWTH 4 DAYS Performed at [facility]    Report Status PENDING  Incomplete  Blood culture (routine x 2)     Status: None (Preliminary result)   Collection Time: 01/19/15  4:23 PM  Result Value Ref Range Status   Specimen Description RIGHT ANTECUBITAL  Final   Special Requests BOTTLES DRAWN AEROBIC AND ANAEROBIC 5CC  Final   Culture    Final    NO GROWTH 4 DAYS Performed at [facility]    Report Status PENDING  Incomplete  Culture, Urine     Status: None   Collection Time: 01/22/15  4:16 PM  Result Value Ref Range Status   Specimen Description URINE, RANDOM  Final   Special Requests NONE  Final   Culture   Final    8,000 COLONIES/mL INSIGNIFICANT GROWTH Performed at [facility]    Report Status 01/23/2015 FINAL  Final     Studies: No results found.  Scheduled Meds: . amLODipine  10 mg Oral Daily  . cholecalciferol  5,000 Units Oral Daily  . cloNIDine  0.1 mg Oral Daily  . collagenase   Topical Daily  .  darbepoetin (ARANESP) injection - NON-DIALYSIS  150 mcg Subcutaneous Q Tue-1800  . enoxaparin (LOVENOX) injection  30 mg Subcutaneous Q24H  . ferrous sulfate  325 mg Oral BID WC  . [START ON 01/25/2015] ferumoxytol  510 mg Intravenous Weekly  . furosemide  80 mg Oral BID  . labetalol  100 mg Oral BID  . latanoprost  1 drop Both Eyes QHS  . pantoprazole  40 mg Oral Daily  . piperacillin-tazobactam (ZOSYN)  IV  2.25 g Intravenous Q8H  . polyethylene glycol  17 g Oral Daily  . sodium chloride  3 mL Intravenous Q12H  . vancomycin  500 mg Intravenous Q48H   Continuous Infusions:   Principal Problem:   Cellulitis of left lower extremity Active Problems:   Hypertension   Chronic kidney disease, stage 4, severely decreased GFR (HCC)   Urinary bladder cancer ([place])   Pancreatic insufficiency (HCC)   Constipation   Abscess of left lower leg   Pressure ulcer    CHIU, STEPHEN K  [facility] Pager [phone]. If 7PM-7AM, please contact night-coverage at [email or web address], password [facility] 01/23/2015, 5:37 PM  LOS: 4 days

## 2015-01-23 NOTE — Progress Notes (Signed)
I met with Ms. [name] daughter Lovett Sox and had extensive conversation about her mother's condition, prognostication and probable disease trajectory. I recommended hospice care services at home- she had many questions about hospice services in general- difficult experience with hospice and her father's death in Mar 26, 2001- she is worried about how her mother will handle hearing "hospice" -she is not opposed to the extra help and open to these services- she has realistic expectations but also maintains her hope for more quality time with her mother. She is going to consider this option and I will re-evaluate her tomorrow and try to help facilitate next steps in her care. I reassured Hoyle Sauer that we were indeed doing everything reasonable and medically appropriate to care for her mother. Patient is responding well to low dose morphine for pain during wound care. Full consult to follow.

## 2015-01-24 DIAGNOSIS — L02416 Cutaneous abscess of left lower limb: Secondary | ICD-10-CM

## 2015-01-24 DIAGNOSIS — Z515 Encounter for palliative care: Secondary | ICD-10-CM

## 2015-01-24 DIAGNOSIS — K8689 Other specified diseases of pancreas: Secondary | ICD-10-CM

## 2015-01-24 LAB — BASIC METABOLIC PANEL
Anion gap: 14 (ref 5–15)
BUN: 103 mg/dL — AB (ref 6–20)
CO2: 18 mmol/L — ABNORMAL LOW (ref 22–32)
CREATININE: 4.88 mg/dL — AB (ref 0.44–1.00)
Calcium: 7.7 mg/dL — ABNORMAL LOW (ref 8.9–10.3)
Chloride: 107 mmol/L (ref 101–111)
GFR, EST AFRICAN AMERICAN: 8 mL/min — AB (ref >60)
GFR, EST NON AFRICAN AMERICAN: 7 mL/min — AB (ref >60)
Glucose, Bld: 133 mg/dL — ABNORMAL HIGH (ref 65–99)
POTASSIUM: 3.8 mmol/L (ref 3.5–5.1)
SODIUM: 139 mmol/L (ref 135–145)

## 2015-01-24 LAB — MAGNESIUM: MAGNESIUM: 1.7 mg/dL (ref 1.7–2.4)

## 2015-01-24 LAB — C DIFFICILE QUICK SCREEN W PCR REFLEX
C DIFFICILE (CDIFF) INTERP: NEGATIVE
C DIFFICILE (CDIFF) TOXIN: NEGATIVE
C DIFFICLE (CDIFF) ANTIGEN: NEGATIVE

## 2015-01-24 LAB — CBC
HCT: 23.6 % — ABNORMAL LOW (ref 36.0–46.0)
Hemoglobin: 7.5 g/dL — ABNORMAL LOW (ref 12.0–15.0)
MCH: 27.7 pg (ref 26.0–34.0)
MCHC: 31.8 g/dL (ref 30.0–36.0)
MCV: 87.1 fL (ref 78.0–100.0)
PLATELETS: 334 10*3/uL (ref 150–400)
RBC: 2.71 MIL/uL — AB (ref 3.87–5.11)
RDW: 15.2 % (ref 11.5–15.5)
WBC: 18.5 10*3/uL — ABNORMAL HIGH (ref 4.0–10.5)

## 2015-01-24 LAB — CULTURE, BLOOD (ROUTINE X 2)
CULTURE: NO GROWTH
CULTURE: NO GROWTH

## 2015-01-24 MED ORDER — LOPERAMIDE HCL 2 MG PO CAPS
2.0000 mg | ORAL_CAPSULE | ORAL | Status: DC | PRN
  Administered 2015-01-24: 2 mg via ORAL
  Filled 2015-01-24: qty 1

## 2015-01-24 NOTE — Progress Notes (Signed)
Patient ID: [name], female   DOB: [birth year], 82 y.o.   MRN: [id number]  [facility] Progress Note   Assessment/ Plan:   1. Acute renal failure on chronic kidney disease stage IV: likely hemodynamically mediated AKI on CKD from cellulitis/SIRS. Creatinine remains relatively unchanged at this time but BUN better. Continue current diuretic dose and agree fully with a palliative care/hospice approach.  Not a candidate for RRT.  2. Left lower extremity cellulitis: Ongoing wound care and currently on intravenous vancomycin and Zosyn- vancomycin levels non-toxic. 3. Anemia: Suspect that infection may be leading to resistance to ESA. S/p IV Fe. 4. Hypertension: On previously prescribed antihypertensive therapy and currently escalation of diuretic therapy to help alleviate pulmonary edema. We'll continue to monitor. 5. Hypokalemia: Secondary to diuretic therapy and orally repleted. 6. Hypoxia/pulmonary edema: Restarted scheduled furosemide yesterday after prior boluses.  Will sign off at this point--please re-consult if needed.  Subjective:   Had a better night yesterday- poor appetite and sleeping most of the time.   Objective:   BP 170/60 mmHg  Pulse 63  Temp(Src) 98.8 F (37.1 C) (Oral)  Resp 17  Ht 5' (1.524 m)  Wt 54.7 kg (120 lb 9.5 oz)  BMI 23.55 kg/m2  SpO2 96%  Physical Exam: [id number] resting in bed- daughter at bedside [id number] RRR, ESM over apex Resp:coarse BS bilaterally- no distinct rhonchi [id number], flat, NT [id number] LE edema  Labs: BMET  Recent Labs Lab 01/19/15 1814 01/20/15 0450 01/21/15 0500 01/22/15 0430 01/23/15 0420 01/24/15 0410  NA 133* 135 139 139 140 139  K 3.5 3.3* 3.3* 3.0* 3.0* 3.8  CL 99* 102 105 106 105 107  CO2 18* 19* 19* 18* 20* 18*  GLUCOSE 159* 156* 121* 166* 145* 133*  BUN 115* 112* 104* 111* 112* 103*  CREATININE 4.54* 4.34* 4.52* 4.85* 4.64* 4.88*  CALCIUM 8.0* 7.9* 8.1* 8.0* 7.6* 7.7*  PHOS 4.7*  --    --   --   --   --    CBC  Recent Labs Lab 01/19/15 1608 01/20/15 0450 01/21/15 0500 01/22/15 0430 01/23/15 0420 01/24/15 0410  WBC 21.7* 24.5* 31.2* 27.1* 23.7* 18.5*  NEUTROABS 18.9* 21.8*  --   --   --   --   HGB 7.9* 7.3* 8.3* 8.0* 7.9* 7.5*  HCT 23.2* 22.4* 25.7* 24.4* 24.5* 23.6*  MCV 86.6 86.2 86.5 87.5 86.9 87.1  PLT 320 313 390 386 357 334   Medications:    . amLODipine  10 mg Oral Daily  . cholecalciferol  5,000 Units Oral Daily  . cloNIDine  0.1 mg Oral Daily  . collagenase   Topical Daily  . darbepoetin (ARANESP) injection - NON-DIALYSIS  150 mcg Subcutaneous Q Tue-1800  . enoxaparin (LOVENOX) injection  30 mg Subcutaneous Q24H  . ferrous sulfate  325 mg Oral BID WC  . [START ON 01/25/2015] ferumoxytol  510 mg Intravenous Weekly  . furosemide  80 mg Oral BID  . labetalol  100 mg Oral BID  . latanoprost  1 drop Both Eyes QHS  . pantoprazole  40 mg Oral Daily  . piperacillin-tazobactam (ZOSYN)  IV  2.25 g Intravenous Q8H  . polyethylene glycol  17 g Oral Daily  . sodium chloride  3 mL Intravenous Q12H  . vancomycin  500 mg Intravenous Q48H   Elmarie Shiley, MD 01/24/2015, 12:05 PM

## 2015-01-24 NOTE — Care Management Note (Signed)
Case Management Note  Patient Details  Name: [name] MRN: [id number] Date of Birth: March 21, 1919  Subjective/Objective:       82 Yo admitted with LLE cellulitis           Action/Plan: Pt from home alone.  Expected Discharge Date:                  Expected Discharge Plan:  Home w Hospice Care  In-House Referral:     Discharge planning Services  CM Consult  Post Acute Care Choice:    Choice offered to:     DME Arranged:    DME Agency:     HH Arranged:  Disease Management [place] Agency:   [facility])  Status of Service:  In process, will continue to follow  Medicare Important Message Given:  Yes Date Medicare IM Given:    Medicare IM give by:    Date Additional Medicare IM Given:    Additional Medicare Important Message give by:     If discussed at [place] of Stay Meetings, dates discussed:    Additional Comments: This CM received phone call from [facility] from [facility] care and hospice. Bambi states that she received referral from Dr. Hilma Favors. Bambi to meet with pt and family at 5pm this evening. This CM faxed H&P, med list and face sheet to [facility] (fax# [phone]) Phone number for Bambi: [phone]). CM will continue to follow and assist with DC needs as needed. Lynnell Catalan, RN 01/24/2015, 12:11 PM

## 2015-01-24 NOTE — Progress Notes (Signed)
[facility] PROGRESS NOTE  [name] [id number] DOB: [birth year] DOA: 01/19/2015 PCP: [name], MD  HPI/Brief narrative 82 year old African-American female, past medical history of hypertension, presented with the complaints of worsening lower extremity edema and worsening left lower extremity wound with purulent drainage. Patient was hospitalized for further management. She was seen by wound care. She also developed pulmonary edema.  Assessment/Plan: Cellulitis of left lower extremity -No abscess seen on MRI.  -Presented with leukocytosis.  -For now, pt is continued on broad-spectrum antibiotics.  -Dr. [name] had discussed with general surgery who recommended consulting vascular surgery due to her chronic venous stasis. Dr. [name] discussed with Dr. [name] with vascular surgery who recommended wound care consult. She is unable to do any kind of surgical debridement in this hospital.  -Continue hydrotherapy.  -Patient is not a candidate for aggressive surgical intervention.  -Per palliative care, recs for morphine injection in hydrogel or collagenase to loosely dress wound and to stop hydrotherapy for more palliative approach  Acute on Chronic kidney disease, stage 4, severely decreased GFR with hypokalemia -Patient's creatinine was 1.96 in August. Her BUN and creatinine are significantly worse compared to those values. -She is followed by a nephrologist in [place] (Dr. [name]).  -She is noted to be on high-dose Lasix at home.  -She was given IV fluids during earlier part of this hospitalization. As chest x-ray on 1/2 suggested pulmonary edema.  -Nephrology was consulted. -Overall poor long-term prognosis.  -Appreciate input by Palliative Care. Patient would be good candidate for hospice services -No significant change in renal function  Pulmonary edema -Likely due to renal failure. -Primary goal is to keep her comfortable.  Essential Hypertension -Continue  labetalol 100 mg po bid. -BP stable thus far  History urinary bladder cancer with diversion conduit -Continue supportive care.  Pancreatic insufficiency -Continue pancreatic enzymes supplementation.  Constipation -Initially continued with Miralax and stool softener -now with diarrhea, thus will stop  Diarrhea -Stop Miralax -Give imodium  History of glaucoma -Stable  Normocytic anemia -Likely due to chronic kidney disease. Hemoglobin is stable.  Code Status: DNR Family Communication: Pt in room Disposition Plan: Possible home with hospice - to discuss    Consultants:  Palliative Care  Nephrology  Procedures:    Antibiotics: Anti-infectives    Start     Dose/Rate Route Frequency Ordered Stop   01/21/15 1400  vancomycin (VANCOCIN) 500 mg in sodium chloride 0.9 % 100 mL IVPB     500 mg 100 mL/hr over 60 Minutes Intravenous Every 48 hours 01/19/15 1956     01/19/15 2200  piperacillin-tazobactam (ZOSYN) IVPB 2.25 g     2.25 g 100 mL/hr over 30 Minutes Intravenous Every 8 hours 01/19/15 2136     01/19/15 1600  vancomycin (VANCOCIN) IVPB 1000 mg/200 mL premix     1,000 mg 200 mL/hr over 60 Minutes Intravenous  Once 01/19/15 1558 01/19/15 1836      HPI/Subjective: Claims to feel better   Objective: Filed Vitals:   01/23/15 1334 01/23/15 2132 01/24/15 1056 01/24/15 1353  BP: 147/46 151/43 170/60 143/46  Pulse: 72 71 63 67  Temp: 97.9 F (36.6 C) 98.8 F (37.1 C)  97.7 F (36.5 C)  TempSrc: Oral Oral  Oral  Resp: 16 17  18   Height:      Weight:    59.376 kg (130 lb 14.4 oz)  SpO2: 96% 96%  99%    Intake/Output Summary (Last 24 hours) at 01/24/15 1538 Last data filed at  01/24/15 1445  Gross per 24 hour  Intake    590 ml  Output    950 ml  Net   -360 ml   Filed Weights   01/19/15 2110 01/22/15 0850 01/24/15 1353  Weight: 55.929 kg (123 lb 4.8 oz) 54.7 kg (120 lb 9.5 oz) 59.376 kg (130 lb 14.4 oz)    Exam:   General:  Awake, laying in bed in  nad  Cardiovascular: regular, s1, s2  Respiratory: normal resp effort, no wheezing  Abdomen: soft,nondistended, pos BS  Musculoskeletal: perfused, no clubbing   Data Reviewed: Basic Metabolic Panel:  Recent Labs Lab 01/19/15 1814 01/20/15 0450 01/21/15 0500 01/22/15 0430 01/23/15 0420 01/24/15 0410  NA 133* 135 139 139 140 139  K 3.5 3.3* 3.3* 3.0* 3.0* 3.8  CL 99* 102 105 106 105 107  CO2 18* 19* 19* 18* 20* 18*  GLUCOSE 159* 156* 121* 166* 145* 133*  BUN 115* 112* 104* 111* 112* 103*  CREATININE 4.54* 4.34* 4.52* 4.85* 4.64* 4.88*  CALCIUM 8.0* 7.9* 8.1* 8.0* 7.6* 7.7*  MG 1.3*  --   --   --  1.2* 1.7  PHOS 4.7*  --   --   --   --   --    Liver Function Tests:  Recent Labs Lab 01/19/15 1814 01/20/15 0450  AST 31 27  ALT 30 24  ALKPHOS 81 68  BILITOT 0.8 0.8  PROT 6.6 5.8*  ALBUMIN 2.5* 2.0*   No results for input(s): LIPASE, AMYLASE in the last 168 hours. No results for input(s): AMMONIA in the last 168 hours. CBC:  Recent Labs Lab 01/19/15 1608 01/20/15 0450 01/21/15 0500 01/22/15 0430 01/23/15 0420 01/24/15 0410  WBC 21.7* 24.5* 31.2* 27.1* 23.7* 18.5*  NEUTROABS 18.9* 21.8*  --   --   --   --   HGB 7.9* 7.3* 8.3* 8.0* 7.9* 7.5*  HCT 23.2* 22.4* 25.7* 24.4* 24.5* 23.6*  MCV 86.6 86.2 86.5 87.5 86.9 87.1  PLT 320 313 390 386 357 334   Cardiac Enzymes: No results for input(s): CKTOTAL, CKMB, CKMBINDEX, TROPONINI in the last 168 hours. BNP (last 3 results)  Recent Labs  08/30/14 1928 01/19/15 1608  BNP 395.6* 235.8*    ProBNP (last 3 results) No results for input(s): PROBNP in the last 8760 hours.  CBG: No results for input(s): GLUCAP in the last 168 hours.  Recent Results (from the past 240 hour(s))  Blood culture (routine x 2)     Status: None   Collection Time: 01/19/15  4:08 PM  Result Value Ref Range Status   Specimen Description LEFT ANTECUBITAL  Final   Special Requests BOTTLES DRAWN AEROBIC AND ANAEROBIC 5CC  Final    Culture   Final    NO GROWTH 5 DAYS Performed at [facility]    Report Status 01/24/2015 FINAL  Final  Blood culture (routine x 2)     Status: None   Collection Time: 01/19/15  4:23 PM  Result Value Ref Range Status   Specimen Description RIGHT ANTECUBITAL  Final   Special Requests BOTTLES DRAWN AEROBIC AND ANAEROBIC 5CC  Final   Culture   Final    NO GROWTH 5 DAYS Performed at [facility]    Report Status 01/24/2015 FINAL  Final  Culture, Urine     Status: None   Collection Time: 01/22/15  4:16 PM  Result Value Ref Range Status   Specimen Description URINE, RANDOM  Final   Special Requests  NONE  Final   Culture   Final    8,000 COLONIES/mL INSIGNIFICANT GROWTH Performed at [facility]    Report Status 01/23/2015 FINAL  Final  C difficile quick scan w PCR reflex     Status: None   Collection Time: 01/24/15  1:23 AM  Result Value Ref Range Status   C Diff antigen NEGATIVE NEGATIVE Final   C Diff toxin NEGATIVE NEGATIVE Final   C Diff interpretation Negative for toxigenic C. difficile  Final     Studies: No results found.  Scheduled Meds: . amLODipine  10 mg Oral Daily  . cholecalciferol  5,000 Units Oral Daily  . cloNIDine  0.1 mg Oral Daily  . collagenase   Topical Daily  . darbepoetin (ARANESP) injection - NON-DIALYSIS  150 mcg Subcutaneous Q Tue-1800  . enoxaparin (LOVENOX) injection  30 mg Subcutaneous Q24H  . ferrous sulfate  325 mg Oral BID WC  . [START ON 01/25/2015] ferumoxytol  510 mg Intravenous Weekly  . furosemide  80 mg Oral BID  . labetalol  100 mg Oral BID  . latanoprost  1 drop Both Eyes QHS  . pantoprazole  40 mg Oral Daily  . piperacillin-tazobactam (ZOSYN)  IV  2.25 g Intravenous Q8H  . polyethylene glycol  17 g Oral Daily  . sodium chloride  3 mL Intravenous Q12H  . vancomycin  500 mg Intravenous Q48H   Continuous Infusions:   Principal Problem:   Cellulitis of left lower extremity Active Problems:   Hypertension    Chronic kidney disease, stage 4, severely decreased GFR (HCC)   Urinary bladder cancer ([place])   Pancreatic insufficiency (HCC)   Constipation   Abscess of left lower leg   Pressure ulcer    Eshawn Coor K  [facility] Pager [phone]. If 7PM-7AM, please contact night-coverage at [email or web address], password [facility] 01/24/2015, 3:38 PM  LOS: 5 days

## 2015-01-24 NOTE — Progress Notes (Signed)
   01/24/15 1300 Hydro treatment.  Subjective Assessment  Subjective daughter again asking the process of the wound. (Addressed by MD and [facility])  Patient and Family Stated Goals to get the wound healed(daughter)  Evaluation and Treatment  Evaluation and Treatment Procedures Explained to Patient/Family Yes  Evaluation and Treatment Procedures agreed to  Wound / Incision (Open or Dehisced) 01/22/15 Other (Comment) Leg Left;Lateral open wound with yellow slough and necrotice tissue (black) arounf the lower border edges.  Date First Assessed/Time First Assessed: 01/22/15 0915   Wound Type: (c) Other (Comment)  Location: Leg  Location Orientation: Left;Lateral  Wound Description (Comments): open wound with yellow slough and necrotice tissue (black) arounf the lower bord...  Dressing Type ABD;Gauze (Comment);Moist to dry (santyl)  Dressing Changed New  Dressing Status Clean;Dry;Intact  Dressing Change Frequency Daily  Site / Wound Assessment Clean  % Wound base Red or Granulating 10%  % Wound base Yellow 90% (stringy slough)  Peri-wound Assessment Edema  Wound Length (cm) 11 cm  Wound Width (cm) 4 cm  Wound Depth (cm) 0.5 cm  Undermining (cm) 2.5  (the depth is less as noted increased obstruction with Q tip,)  Margins Unattached edges (unapproximated)  Drainage Amount Moderate  Drainage Description Green;Serosanguineous  Non-staged Wound Description Full thickness  Hydrotherapy  Pulsed lavage therapy - wound location lateral left leg  Pulsed Lavage with Suction (psi) 12 psi  Pulsed Lavage with Suction - Normal Saline Used 1000 mL  Pulsed Lavage Tip Tip with splash shield  Wound Therapy - Assess/Plan/Recommendations  Wound Therapy - Clinical Statement The L foot on the lateral edges have now a mottled appearance, as well as a small reddened area on fifth met head. Maudie Flakes, [facility] and Dr. Hilma Favors in to observe and discuss the [place]. Will continue Hydro until decision for GOC are  established.  Wound Therapy - Functional Problem List non ambulatory  Factors Delaying/Impairing Wound Healing Immobility;Multiple medical problems  Hydrotherapy Plan Debridement;Dressing change;Patient/family education;Pulsatile lavage with suction  Wound Therapy - Frequency 6X / week  Wound Therapy - Current Recommendations Case manager/social work;WOC nurse  Wound Therapy - Follow Up Recommendations Skilled nursing facility (unless family wants to return home.)  Wound Therapy Goals - Improve the function of patient's integumentary system by progressing the wound(s) through the phases of wound healing by:  Decrease Necrotic Tissue to 50  Decrease Necrotic Tissue - Progress Progressing toward goal  Increase Granulation Tissue to 50  Increase Granulation Tissue - Progress Progressing toward goal  Decrease Length/Width/Depth by (cm) .5  Decrease Length/Width/Depth - Progress Progressing toward goal  Improve Drainage Characteristics Min;Serous  Improve Drainage Characteristics - Progress Progressing toward goal  Patient/Family will be able to  reposition for pressure relief.  Patient/Family Instruction Goal - Progress Progressing toward goal  Goals/treatment plan/discharge plan were made with and agreed upon by patient/family Yes  Time For Goal Achievement 2 weeks  Wound Therapy - Potential for Goals Apolonio Schneiders PT [phone]

## 2015-01-24 NOTE — Progress Notes (Signed)
Prevalon boots applied to both feet earlier this shift.

## 2015-01-24 NOTE — Progress Notes (Signed)
Physical Therapy Discharge Patient Details Name: [name] MRN: [id number] DOB: [birth year] Today's Date: 01/24/2015 Time:  -     Patient discharged from PT services secondary to ; per Dr. Delanna Ahmadi note of this AM who recommends stopping PLS and continuing with dressing changes by nursing with pain relief dressings.   Progress and discharge plan discussed withcaregiver:  GP     Marcelino Freestone PT [id number]   01/24/2015, 4:42 PM

## 2015-01-24 NOTE — Consult Note (Signed)
WOC wound follow up Wound type:LLE vasculitic ulceration on left lateral LE. Seen in conjunction with hydrotherapy visit. Daughter desires Unna's Boot to be discontinues to RLE.  Daughter is applying bag balm to bilateral LEs today and understands why this is not provided by hospital.  Noted and appreciated is Palliative Care MDs input.  Discussion regarding when mother's LLE will heal held outside patient's room at daughter's request.  May have unrealistic expectations regarding ability of the host to recover this tissue destruction and I reiterate the physician's words of preventing rehsopitalizations and keeping wound clean.  Noted today is a 1cm round red/purple macule on the left lateral midfoot and some purple mottling/discoloration along the the lateral plantar aspect of the foot.  Will provide bilateral heel boots (pressure redistribution).   Measurement:See PT notes Wound bed: 80% yellow adherent slough, 20% pink toward proximal end Drainage (amount, consistency, odor) serous to light yellow Periwound: dry.  Boggy at distal portion where undermining to 2.4cm exists (5-7 o'clock).   Dressing procedure/placement/frequency: Continue hydrotherapy through Monday (except Sunday) and reevaluate at that time with hydrotherapy if patient is still an inpatient.  Continue collagenase. Conservative sharp wound debridement (CSWD performed at the bedside): By hydrotherapy.  Superior nursing team will follow along with PT, and will remain available to this patient, the nursing and medical teams.   Thanks, Maudie Flakes, MSN, RN, [place], Arther Abbott  Pager# [phone]

## 2015-01-24 NOTE — Progress Notes (Signed)
Met with patient and her daughter and examined her wound this AM. Patient had significant amount of pain with hydrotherapy and wound bed packing. We discussed long term goals and likelihood of wound healing within the context of her large issues with renal failure and advanced age. At this point I recommend a more palliative approach- keep the wound clean- free of dead tissue as much as possible and reduce pain from the wound. We discussed her current state being her new normal. [name] is open to hospice care at home but doesn't want her mother to know it is "hospice" due to prior experience with her father who died in 03-09-2001 with hospice care at home. She will need a high level of support at home- she wants to avoid skilled facility if at all possible.   Recommendations: 1. Pain from Wound:  Mixture of 10 mg of morphine sulfate injection (10 mg/ml) in 8 gm of Hydrogel or Collagenase. Cover the wound with the gel (usually using 5-10 ml) and then loosely dress it with gauze and wrap-agree that prebvalon boot will provide protection.  Treat with oral Morphine SL 30 minutes prior to drg change  Recommend stopping hydrotherpy per wound care to take a more palliative approach - I discussed this with her daughter who is in agreement.  2. Discharge with [place] when medically ready- will need hospital bed and DME.  3. Continue Diuresis for comfort  4. Patient continues to take PO with assistance. No Artificial Feeding.  5. Avoid re-hospitalization.  Time: 9:45-10:45 Time: 60 minutes Greater than 50%  of this time was spent counseling and coordinating care related to the above assessment and plan.  Lane Hacker, DO Palliative Medicine [phone]

## 2015-01-25 ENCOUNTER — Inpatient Hospital Stay (HOSPITAL_COMMUNITY): Admission: RE | Admit: 2015-01-25 | Payer: Commercial Managed Care - HMO | Source: Ambulatory Visit

## 2015-01-25 LAB — BASIC METABOLIC PANEL
Anion gap: 16 — ABNORMAL HIGH (ref 5–15)
BUN: 109 mg/dL — AB (ref 6–20)
CHLORIDE: 106 mmol/L (ref 101–111)
CO2: 19 mmol/L — ABNORMAL LOW (ref 22–32)
CREATININE: 4.95 mg/dL — AB (ref 0.44–1.00)
Calcium: 7.9 mg/dL — ABNORMAL LOW (ref 8.9–10.3)
GFR, EST AFRICAN AMERICAN: 8 mL/min — AB (ref >60)
GFR, EST NON AFRICAN AMERICAN: 7 mL/min — AB (ref >60)
Glucose, Bld: 133 mg/dL — ABNORMAL HIGH (ref 65–99)
POTASSIUM: 3.8 mmol/L (ref 3.5–5.1)
SODIUM: 141 mmol/L (ref 135–145)

## 2015-01-25 LAB — CBC
HCT: 24.3 % — ABNORMAL LOW (ref 36.0–46.0)
Hemoglobin: 7.9 g/dL — ABNORMAL LOW (ref 12.0–15.0)
MCH: 28.9 pg (ref 26.0–34.0)
MCHC: 32.5 g/dL (ref 30.0–36.0)
MCV: 89 fL (ref 78.0–100.0)
Platelets: 351 10*3/uL (ref 150–400)
RBC: 2.73 MIL/uL — AB (ref 3.87–5.11)
RDW: 16.3 % — ABNORMAL HIGH (ref 11.5–15.5)
WBC: 16 10*3/uL — AB (ref 4.0–10.5)

## 2015-01-25 MED ORDER — PANTOPRAZOLE SODIUM 40 MG PO TBEC: 40.0000 mg | DELAYED_RELEASE_TABLET | Freq: Every day | ORAL | Status: AC

## 2015-01-25 MED ORDER — CEPHALEXIN 250 MG PO CAPS: 250.0000 mg | ORAL_CAPSULE | Freq: Two times a day (BID) | ORAL | Status: AC

## 2015-01-25 MED ORDER — AMBULATORY NON FORMULARY MEDICATION: Status: AC

## 2015-01-25 MED ORDER — LOPERAMIDE HCL 2 MG PO CAPS: 2.0000 mg | ORAL_CAPSULE | ORAL | Status: AC | PRN

## 2015-01-25 MED ORDER — MORPHINE SULFATE (CONCENTRATE) 10 MG/0.5ML PO SOLN: 10.0000 mg | ORAL | Status: DC | PRN

## 2015-01-25 MED ORDER — CEPHALEXIN 250 MG PO CAPS
250.0000 mg | ORAL_CAPSULE | Freq: Two times a day (BID) | ORAL | Status: DC
  Administered 2015-01-25: 250 mg via ORAL
  Filled 2015-01-25 (×3): qty 1

## 2015-01-25 MED ORDER — FUROSEMIDE 80 MG PO TABS: 80.0000 mg | ORAL_TABLET | Freq: Two times a day (BID) | ORAL | Status: AC

## 2015-01-25 MED ORDER — OXYCODONE HCL 20 MG/ML PO CONC
5.0000 mg | ORAL | Status: DC | PRN
  Filled 2015-01-25: qty 1

## 2015-01-25 MED ORDER — OXYCODONE HCL 5 MG PO TABS: 2.5000 mg | ORAL_TABLET | Freq: Four times a day (QID) | ORAL | Status: AC | PRN

## 2015-01-25 MED ORDER — CAMPHOR-MENTHOL 0.5-0.5 % EX LOTN: 1.0000 "application " | TOPICAL_LOTION | CUTANEOUS | Status: AC | PRN

## 2015-01-25 MED ORDER — ACETAMINOPHEN 325 MG PO TABS
650.0000 mg | ORAL_TABLET | Freq: Three times a day (TID) | ORAL | Status: DC
  Administered 2015-01-25: 650 mg via ORAL
  Filled 2015-01-25: qty 2

## 2015-01-25 MED ORDER — HYDROXYZINE HCL 10 MG PO TABS: 10.0000 mg | ORAL_TABLET | Freq: Three times a day (TID) | ORAL | Status: AC | PRN

## 2015-01-25 MED ORDER — HYDROMORPHONE HCL 1 MG/ML PO LIQD: 2.0000 mg | ORAL | Status: DC | PRN

## 2015-01-25 MED ORDER — ACETAMINOPHEN 325 MG PO TABS: 650.0000 mg | ORAL_TABLET | Freq: Three times a day (TID) | ORAL | Status: AC

## 2015-01-25 MED ORDER — COLLAGENASE 250 UNIT/GM EX OINT: TOPICAL_OINTMENT | Freq: Every day | CUTANEOUS | Status: AC

## 2015-01-25 MED ORDER — POLYVINYL ALCOHOL 1.4 % OP SOLN: 1.0000 [drp] | Freq: Two times a day (BID) | OPHTHALMIC | Status: AC | PRN

## 2015-01-25 MED ORDER — OXYCODONE HCL 20 MG/ML PO CONC: 10.0000 mg | ORAL | Status: AC | PRN

## 2015-01-25 MED ORDER — TRIAMCINOLONE ACETONIDE 0.5 % EX OINT: 1.0000 "application " | TOPICAL_OINTMENT | Freq: Two times a day (BID) | CUTANEOUS | Status: AC | PRN

## 2015-01-25 NOTE — Progress Notes (Signed)
Patient d/c home with hospice,transported by [facility]. Patient is stable.

## 2015-01-25 NOTE — Discharge Summary (Addendum)
Physician Discharge Summary  [name] [name] DOB: Jul 06, 1919 DOA: 01/19/2015  PCP: [name], MD  Admit date: 01/19/2015 Discharge date: 01/25/2015  Time spent: 20 minutes  Recommendations for Outpatient Follow-up:  1. Follow up with PCP in 2-3 weeks 2. Follow up with Nephrologist, Dr. [name] as needed 3. Wound Care Recs:  Mixture of 10 mg of morphine sulfate injection (10 mg/ml) in 8 gm of Hydrogel or Collagenase. Cover the wound with the gel (usually using 5-10 ml) and then loosely dress it with gauze and wrap-agree that prebvalon boot will provide protection.  Treat with pain med 30 minutes prior to dressing change  Recommend stopping hydrotherpy per wound care to take a more palliative approach   Discharge Diagnoses:  Principal Problem:   Cellulitis of left lower extremity Active Problems:   Hypertension   Chronic kidney disease, stage 4, severely decreased GFR (HCC)   Urinary bladder cancer (HCC)   Pancreatic insufficiency (HCC)   Constipation   Abscess of left lower leg   Pressure ulcer   Discharge Condition: Stable  Diet recommendation: Renal  Filed Weights   01/22/15 0850 01/24/15 1353 01/25/15 0306  Weight: 54.7 kg (120 lb 9.5 oz) 59.376 kg (130 lb 14.4 oz) 56.8 kg (125 lb 3.5 oz)    History of present illness:  Please review dictated H and P from 12/31 for details. Briefly, 82 year old African-American female, past medical history of hypertension, presented with the complaints of worsening lower extremity edema and worsening left lower extremity wound with purulent drainage. Patient was hospitalized for further management. She was seen by wound care. She also developed pulmonary edema.  Hospital Course:  Cellulitis of left lower extremity -No abscess seen on MRI.  -Presented with leukocytosis.  -Patient was continued on broad-spectrum antibiotics.  -Dr. [name] had discussed with general surgery who recommended consulting vascular surgery  due to her chronic venous stasis. Dr. [name] discussed with Dr. [name] with vascular surgery who recommended wound care consult. She is unable to do any kind of surgical debridement in this hospital.  -Initially continued hydrotherapy, however, per palliative care, recs for morphine injection in hydrogel or collagenase to loosely dress wound and to stop hydrotherapy for more palliative approach  -Patient is not a candidate for aggressive surgical intervention.  -Patient to complete 3 more days of keflex on discharge  Acute on Chronic kidney disease, stage 4, severely decreased GFR with hypokalemia -Patient's creatinine was 1.96 in August. Her BUN and creatinine are significantly worse compared to those values. -She is followed by a nephrologist in [place] (Dr. [name]).  -She is noted to be on high-dose Lasix at home.  -She was given IV fluids during earlier part of this hospitalization. As chest x-ray on 1/2 suggested pulmonary edema.  -Nephrology was consulted and had been following -Overall poor long-term prognosis.  -Appreciate input by Palliative Care. Patient would be good candidate for hospice services -No significant change in renal function -Nephrology recs to continue current diuretic dose  Pulmonary edema -Likely due to renal failure. -Primary goal is to keep her comfortable.  Essential Hypertension -Continue labetalol 100 mg po bid. -BP stable thus far  History urinary bladder cancer with diversion conduit -Continue supportive care.  Pancreatic insufficiency -Continue pancreatic enzymes supplementation.  Constipation -Initially continued with Miralax and stool softener -now with diarrhea, thus will stop  Diarrhea -Stop Miralax -Give imodium  History of glaucoma -Stable  Normocytic anemia -Likely due to chronic kidney disease. Hemoglobin stable.  Stage 2 Pressure injury  sacrum and right ischium  Consultations:  Palliative Care  Nephrology  Discharge  Exam: Filed Vitals:   01/24/15 2247 01/25/15 0306 01/25/15 0602 01/25/15 1010  BP: 159/48  153/45 166/44  Pulse: 77  75 80  Temp: 98.5 F (36.9 C)  98.5 F (36.9 C)   TempSrc: Oral  Oral   Resp: 16  20   Height:      Weight:  56.8 kg (125 lb 3.5 oz)    SpO2: 97%  93%     General: Awake, in nad Cardiovascular: regular, s1, s2 Respiratory: normal resp effort, no wheezing  Discharge Instructions     Medication List    STOP taking these medications        ciprofloxacin 500 MG tablet  Commonly known as:  CIPRO     doxycycline 100 MG tablet  Commonly known as:  VIBRA-TABS     DSS 100 MG Caps     metolazone 2.5 MG tablet  Commonly known as:  ZAROXOLYN     polyethylene glycol packet  Commonly known as:  MIRALAX / GLYCOLAX      TAKE these medications        amLODipine 10 MG tablet  Commonly known as:  NORVASC  Take 1 tablet (10 mg total) by mouth daily.     cephALEXin 250 MG capsule  Commonly known as:  KEFLEX  Take 1 capsule (250 mg total) by mouth 2 (two) times daily.     cholecalciferol 1000 units tablet  Commonly known as:  VITAMIN D  Take 5,000 Units by mouth daily.     cloNIDine 0.1 MG tablet  Commonly known as:  CATAPRES  Take 1 tablet (0.1 mg total) by mouth 3 (three) times daily.     collagenase ointment  Commonly known as:  SANTYL  Apply topically daily.     ferrous sulfate 325 (65 FE) MG tablet  Take 325 mg by mouth 2 (two) times daily with a meal.     furosemide 80 MG tablet  Commonly known as:  LASIX  Take 1 tablet (80 mg total) by mouth 2 (two) times daily.     hydrOXYzine 10 MG tablet  Commonly known as:  ATARAX/VISTARIL  Take 1 tablet (10 mg total) by mouth 3 (three) times daily as needed for itching.     labetalol 100 MG tablet  Commonly known as:  NORMODYNE  Take 100 mg by mouth 2 (two) times daily.     lipase/protease/amylase 36000 UNITS Cpep capsule  Commonly known as:  CREON  Take 36,000 Units by mouth 3 (three) times daily  as needed (with meals).     loperamide 2 MG capsule  Commonly known as:  IMODIUM  Take 1 capsule (2 mg total) by mouth as needed for diarrhea or loose stools.     oxyCODONE 5 MG immediate release tablet  Commonly known as:  Oxy IR/ROXICODONE  Take 0.5 tablets (2.5 mg total) by mouth every 6 (six) hours as needed for moderate pain, severe pain or breakthrough pain.     pantoprazole 40 MG tablet  Commonly known as:  PROTONIX  Take 1 tablet (40 mg total) by mouth daily.     polyvinyl alcohol 1.4 % ophthalmic solution  Commonly known as:  LIQUIFILM TEARS  Place 1 drop into both eyes 2 (two) times daily as needed for dry eyes.     REFRESH OP  Apply 1 drop to eye daily as needed (dry eyes).     Travoprost (BAK  Free) 0.004 % Soln ophthalmic solution  Commonly known as:  TRAVATAN  Place 1 drop into both eyes at bedtime.     triamcinolone ointment 0.5 %  Commonly known as:  KENALOG  Apply 1 application topically 2 (two) times daily as needed. To affected areas as needed       No Known Allergies    The results of significant diagnostics from this hospitalization (including imaging, microbiology, ancillary and laboratory) are listed below for reference.    Significant Diagnostic Studies: US Renal  12/27/2014  CLINICAL DATA:  Chronic renal insufficiency, previous cystectomy with creation of ileal conduit EXAM: RENAL / URINARY TRACT ULTRASOUND COMPLETE COMPARISON:  Noncontrast abdominal CT scan of October 01, 2012 FINDINGS: Right Kidney: Length: 7.8 cm. The renal cortical echotexture is markedly increased as compared to the adjacent liver. There is a lower pole cyst measuring approximately 1.4 cm in greatest dimension. There is no hydronephrosis. Left Kidney: Length: Not obtainable. The left kidney is smaller than the right but its cortical margins are poorly defined. The cortical echotexture is increased and the renal pelvis appears dilated. Renal pelvis dilation was demonstrated on the  previous CT scan. Bladder: The urinary bladder is surgically absent. IMPRESSION: The echotexture both kidneys is increased. On the left there is chronic hydronephrosis. The study is limited due to the patient's body habitus and the markedly increased renal cortical echotexture. Electronically Signed   By: David  Martinique M.D.   On: 12/27/2014 13:03   Mr Tibia Fibula Left Wo Contrast  01/19/2015  CLINICAL DATA:  Infection with left leg swelling. Nonhealing wound along the left calf. EXAM: MRI OF LOWER LEFT EXTREMITY WITHOUT CONTRAST TECHNIQUE: Multiplanar, multisequence MR imaging of the left calf was performed. No intravenous contrast was administered. COMPARISON:  08/30/2014 FINDINGS: Severe motion artifact is present and reduces diagnostic sensitivity and specificity. There is extensive diffuse subcutaneous edema along the knees and lower legs bilaterally. On the left side, posterior soft tissue defect overlying the gastrocnemius musculature is noted in the proximal calf, tracking down to the mid calf region. Further distally, along the lateral lower calf and extending over a 10.6 cm excursion, there is a subcutaneous tissue defect in the left calf with irregular margins of the subcutaneous tissues and concern for overlying cutaneous ulceration. No drainable abscess is identified on either side. No discrete osteomyelitis is identified. I am doubtful of myositis given the appearance although subtle myositis could be occult due to the severe degree of motion artifact. IMPRESSION: 1. Cutaneous and subcutaneous ulceration/defect in the distal lateral calf and in the proximal posterior calf, without underlying abscess or osteomyelitis identified. 2. No definite myositis although negative predictive value is reduced due to the severity of motion artifact. 3. Diffuse subcutaneous edema around both knees and both calves, nonspecific but possibly from venous insufficiency or cellulitis. Electronically Signed   By: Van Clines M.D.   On: 01/19/2015 17:25   Dg Chest Port 1 View  01/21/2015  CLINICAL DATA:  Shortness of breath and hypoxia EXAM: PORTABLE CHEST 1 VIEW COMPARISON:  08/30/2014 FINDINGS: 0834 hours. Bilateral interstitial and asymmetric airspace disease noted, right greater than left. Small to moderate bilateral pleural effusions are associated. The cardio pericardial silhouette is enlarged. Bones are diffusely demineralized. IMPRESSION: Asymmetric pulmonary edema versus diffuse infection. Small the moderate bilateral pleural effusions. Electronically Signed   By: Misty Stanley M.D.   On: 01/21/2015 09:34    Microbiology: Recent Results (from the past 240 hour(s))  Blood culture (routine  x 2)     Status: None   Collection Time: 01/19/15  4:08 PM  Result Value Ref Range Status   Specimen Description LEFT ANTECUBITAL  Final   Special Requests BOTTLES DRAWN AEROBIC AND ANAEROBIC 5CC  Final   Culture   Final    NO GROWTH 5 DAYS Performed at [facility]    Report Status 01/24/2015 FINAL  Final  Blood culture (routine x 2)     Status: None   Collection Time: 01/19/15  4:23 PM  Result Value Ref Range Status   Specimen Description RIGHT ANTECUBITAL  Final   Special Requests BOTTLES DRAWN AEROBIC AND ANAEROBIC 5CC  Final   Culture   Final    NO GROWTH 5 DAYS Performed at [facility]    Report Status 01/24/2015 FINAL  Final  Culture, Urine     Status: None   Collection Time: 01/22/15  4:16 PM  Result Value Ref Range Status   Specimen Description URINE, RANDOM  Final   Special Requests NONE  Final   Culture   Final    8,000 COLONIES/mL INSIGNIFICANT GROWTH Performed at [facility]    Report Status 01/23/2015 FINAL  Final  C difficile quick scan w PCR reflex     Status: None   Collection Time: 01/24/15  1:23 AM  Result Value Ref Range Status   C Diff antigen NEGATIVE NEGATIVE Final   C Diff toxin NEGATIVE NEGATIVE Final   C Diff interpretation Negative for  toxigenic C. difficile  Final     Labs: Basic Metabolic Panel:  Recent Labs Lab 01/19/15 1814  01/21/15 0500 01/22/15 0430 01/23/15 0420 01/24/15 0410 01/25/15 0422  NA 133*  < > 139 139 140 139 141  K 3.5  < > 3.3* 3.0* 3.0* 3.8 3.8  CL 99*  < > 105 106 105 107 106  CO2 18*  < > 19* 18* 20* 18* 19*  GLUCOSE 159*  < > 121* 166* 145* 133* 133*  BUN 115*  < > 104* 111* 112* 103* 109*  CREATININE 4.54*  < > 4.52* 4.85* 4.64* 4.88* 4.95*  CALCIUM 8.0*  < > 8.1* 8.0* 7.6* 7.7* 7.9*  MG 1.3*  --   --   --  1.2* 1.7  --   PHOS 4.7*  --   --   --   --   --   --   < > = values in this interval not displayed. Liver Function Tests:  Recent Labs Lab 01/19/15 1814 01/20/15 0450  AST 31 27  ALT 30 24  ALKPHOS 81 68  BILITOT 0.8 0.8  PROT 6.6 5.8*  ALBUMIN 2.5* 2.0*   No results for input(s): LIPASE, AMYLASE in the last 168 hours. No results for input(s): AMMONIA in the last 168 hours. CBC:  Recent Labs Lab 01/19/15 1608 01/20/15 0450 01/21/15 0500 01/22/15 0430 01/23/15 0420 01/24/15 0410 01/25/15 0422  WBC 21.7* 24.5* 31.2* 27.1* 23.7* 18.5* 16.0*  NEUTROABS 18.9* 21.8*  --   --   --   --   --   HGB 7.9* 7.3* 8.3* 8.0* 7.9* 7.5* 7.9*  HCT 23.2* 22.4* 25.7* 24.4* 24.5* 23.6* 24.3*  MCV 86.6 86.2 86.5 87.5 86.9 87.1 89.0  PLT 320 313 390 386 357 334 351   Cardiac Enzymes: No results for input(s): CKTOTAL, CKMB, CKMBINDEX, TROPONINI in the last 168 hours. BNP: BNP (last 3 results)  Recent Labs  08/30/14 1928 01/19/15 1608  BNP 395.6* 235.8*  ProBNP (last 3 results) No results for input(s): PROBNP in the last 8760 hours.  CBG: No results for input(s): GLUCAP in the last 168 hours.  Signed:  Voncille Simm K  [facility] 01/25/2015, 11:33 AM

## 2015-01-25 NOTE — Progress Notes (Addendum)
Pt discharging home with hospice and CSW was notified that pt needing ambulance transport home.   CSW confirmed address with pt daughter.   Pt daughter setting up equipment at home then plans to come to hospital and would like transport called at that time.   CSW discussed with RN and Financial controller and they will call transport when pt ready.   CSW provided ambulance packet to unit secretary and provided information to call non-emergency ambulance transport.(Before 5 pm call [phone]; if after 5 pm [phone] option 1 for English & option 3 for non-emergency ambulance)  No further social work needs identified at this time.  CSW signing off.   Alison Murray, MSW, [place] Work [phone]

## 2015-01-25 NOTE — Progress Notes (Signed)
Discharge instructions and prescriptions given to daughter, who is the primary caregiver, verbalized understanding, teach back utilized. Questions answered appropriately. Patient is alert, awake.No c/o pain. Awaiting for [facility] to transport the patient home.

## 2015-06-20 DEATH — deceased

## 2016-10-02 IMAGING — DX DG CHEST 1V PORT
1 series · 1 of 1 positions shown · non-contrast
Comparison: 08/30/2014

CLINICAL DATA: Shortness of breath and hypoxia

EXAM:
PORTABLE CHEST 1 VIEW

[chest ap]
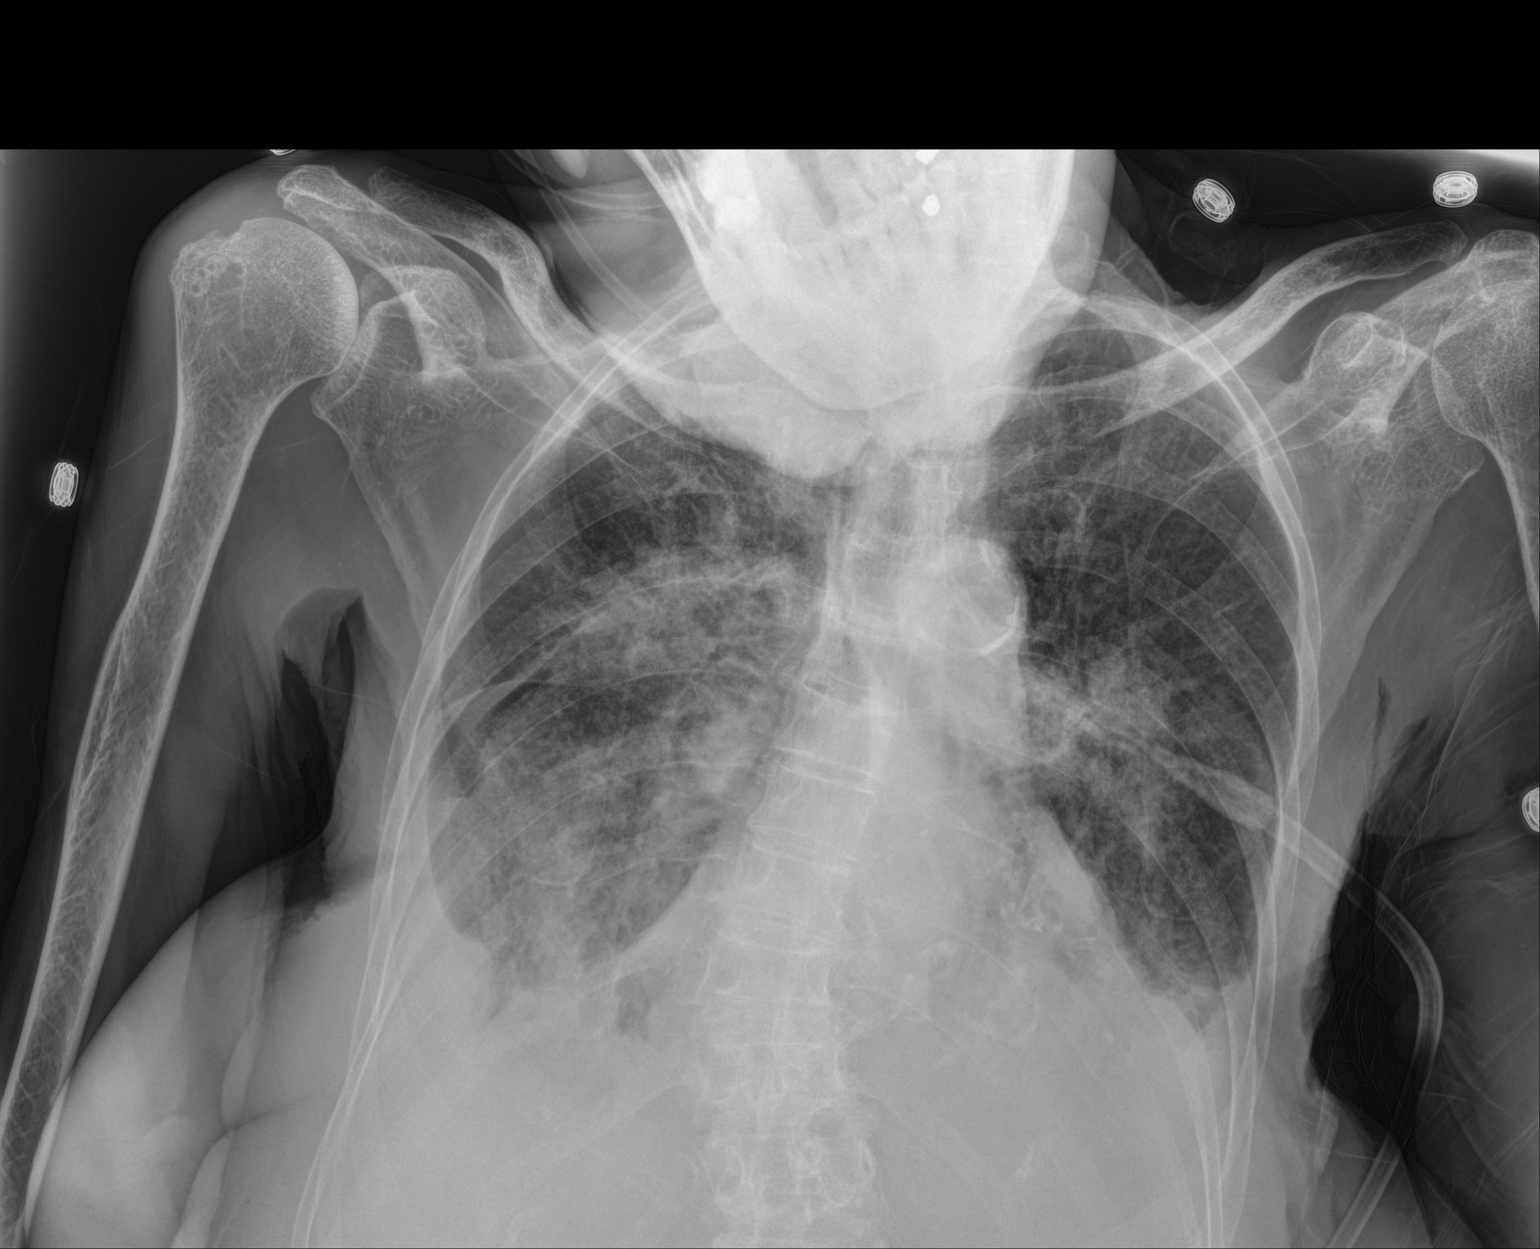

[1 of 1 positions shown; findings below may reference images not displayed]

FINDINGS: 5605 hours. Bilateral interstitial and asymmetric airspace disease
noted, right greater than left. Small to moderate bilateral pleural
effusions are associated. The cardio pericardial silhouette is
enlarged. Bones are diffusely demineralized.
IMPRESSION: Asymmetric pulmonary edema versus diffuse infection.

Small the moderate bilateral pleural effusions.
